# Patient Record
Sex: Male | Born: 1935 | Race: White | Hispanic: No | Smoking: Never smoker
Health system: Southern US, Community
[De-identification: ages and names within clinical notes are randomized; demographics above are authoritative.]

## PROBLEM LIST (undated history)

## (undated) DIAGNOSIS — I351 Nonrheumatic aortic (valve) insufficiency: Secondary | ICD-10-CM

## (undated) DIAGNOSIS — F32A Depression, unspecified: Secondary | ICD-10-CM

## (undated) DIAGNOSIS — F419 Anxiety disorder, unspecified: Secondary | ICD-10-CM

## (undated) DIAGNOSIS — M1990 Unspecified osteoarthritis, unspecified site: Secondary | ICD-10-CM

## (undated) DIAGNOSIS — I509 Heart failure, unspecified: Secondary | ICD-10-CM

## (undated) DIAGNOSIS — G4700 Insomnia, unspecified: Secondary | ICD-10-CM

## (undated) DIAGNOSIS — F329 Major depressive disorder, single episode, unspecified: Secondary | ICD-10-CM

## (undated) DIAGNOSIS — I071 Rheumatic tricuspid insufficiency: Secondary | ICD-10-CM

## (undated) HISTORY — DX: Rheumatic tricuspid insufficiency: I07.1

## (undated) HISTORY — PX: HEMORRHOID SURGERY: SHX153

## (undated) HISTORY — DX: Major depressive disorder, single episode, unspecified: F32.9

## (undated) HISTORY — DX: Insomnia, unspecified: G47.00

## (undated) HISTORY — DX: Depression, unspecified: F32.A

## (undated) HISTORY — PX: HERNIA REPAIR: SHX51

## (undated) HISTORY — DX: Nonrheumatic aortic (valve) insufficiency: I35.1

## (undated) HISTORY — DX: Anxiety disorder, unspecified: F41.9

## (undated) HISTORY — DX: Unspecified osteoarthritis, unspecified site: M19.90

## (undated) HISTORY — PX: TOTAL KNEE ARTHROPLASTY: SHX125

## (undated) HISTORY — PX: APPENDECTOMY: SHX54

## (undated) HISTORY — PX: OTHER SURGICAL HISTORY: SHX169

---

## 2001-01-02 ENCOUNTER — Ambulatory Visit (HOSPITAL_COMMUNITY): Admission: RE | Admit: 2001-01-02 | Discharge: 2001-01-02 | Payer: Self-pay | Admitting: Specialist

## 2001-01-28 ENCOUNTER — Ambulatory Visit (HOSPITAL_COMMUNITY): Admission: RE | Admit: 2001-01-28 | Discharge: 2001-01-28 | Payer: Self-pay | Admitting: Specialist

## 2001-11-09 ENCOUNTER — Encounter: Payer: Self-pay | Admitting: Family Medicine

## 2001-11-09 ENCOUNTER — Ambulatory Visit (HOSPITAL_COMMUNITY): Admission: RE | Admit: 2001-11-09 | Discharge: 2001-11-09 | Payer: Self-pay | Admitting: Family Medicine

## 2001-12-23 ENCOUNTER — Encounter: Admission: RE | Admit: 2001-12-23 | Discharge: 2001-12-23 | Payer: Self-pay | Admitting: Family Medicine

## 2001-12-23 ENCOUNTER — Encounter: Payer: Self-pay | Admitting: Family Medicine

## 2002-08-07 ENCOUNTER — Encounter: Payer: Self-pay | Admitting: Urology

## 2002-08-20 ENCOUNTER — Ambulatory Visit (HOSPITAL_COMMUNITY): Admission: RE | Admit: 2002-08-20 | Discharge: 2002-08-20 | Payer: Self-pay | Admitting: Urology

## 2002-12-12 ENCOUNTER — Encounter: Payer: Self-pay | Admitting: Orthopedic Surgery

## 2002-12-17 ENCOUNTER — Encounter: Payer: Self-pay | Admitting: Orthopedic Surgery

## 2002-12-17 ENCOUNTER — Inpatient Hospital Stay (HOSPITAL_COMMUNITY): Admission: RE | Admit: 2002-12-17 | Discharge: 2002-12-23 | Payer: Self-pay | Admitting: Orthopedic Surgery

## 2002-12-23 ENCOUNTER — Inpatient Hospital Stay (HOSPITAL_COMMUNITY)
Admission: RE | Admit: 2002-12-23 | Discharge: 2003-01-03 | Payer: Self-pay | Admitting: Physical Medicine & Rehabilitation

## 2003-09-04 ENCOUNTER — Encounter: Admission: RE | Admit: 2003-09-04 | Discharge: 2003-09-04 | Payer: Self-pay | Admitting: Family Medicine

## 2004-02-26 ENCOUNTER — Emergency Department (HOSPITAL_COMMUNITY): Admission: EM | Admit: 2004-02-26 | Discharge: 2004-02-26 | Payer: Self-pay | Admitting: Emergency Medicine

## 2006-10-05 ENCOUNTER — Encounter: Admission: RE | Admit: 2006-10-05 | Discharge: 2006-10-05 | Payer: Self-pay | Admitting: Family Medicine

## 2007-02-11 ENCOUNTER — Inpatient Hospital Stay (HOSPITAL_COMMUNITY): Admission: EM | Admit: 2007-02-11 | Discharge: 2007-02-14 | Payer: Self-pay | Admitting: Emergency Medicine

## 2007-03-25 ENCOUNTER — Encounter: Admission: RE | Admit: 2007-03-25 | Discharge: 2007-03-25 | Payer: Self-pay | Admitting: Family Medicine

## 2007-04-21 ENCOUNTER — Emergency Department (HOSPITAL_COMMUNITY): Admission: EM | Admit: 2007-04-21 | Discharge: 2007-04-21 | Payer: Self-pay | Admitting: Emergency Medicine

## 2010-05-09 ENCOUNTER — Inpatient Hospital Stay (HOSPITAL_COMMUNITY)
Admission: EM | Admit: 2010-05-09 | Discharge: 2010-05-10 | DRG: 191 | Disposition: A | Discharge status: Home Health | Payer: Medicare Other | Attending: Internal Medicine | Admitting: Internal Medicine

## 2010-05-09 ENCOUNTER — Emergency Department (HOSPITAL_COMMUNITY): Payer: Medicare Other

## 2010-05-09 DIAGNOSIS — J479 Bronchiectasis, uncomplicated: Principal | ICD-10-CM | POA: Diagnosis present

## 2010-05-09 DIAGNOSIS — F341 Dysthymic disorder: Secondary | ICD-10-CM | POA: Diagnosis present

## 2010-05-09 DIAGNOSIS — R269 Unspecified abnormalities of gait and mobility: Secondary | ICD-10-CM | POA: Diagnosis present

## 2010-05-09 DIAGNOSIS — M1990 Unspecified osteoarthritis, unspecified site: Secondary | ICD-10-CM | POA: Diagnosis present

## 2010-05-09 DIAGNOSIS — Z66 Do not resuscitate: Secondary | ICD-10-CM | POA: Diagnosis present

## 2010-05-09 DIAGNOSIS — Z880 Allergy status to penicillin: Secondary | ICD-10-CM

## 2010-05-09 DIAGNOSIS — Z96659 Presence of unspecified artificial knee joint: Secondary | ICD-10-CM

## 2010-05-09 DIAGNOSIS — R042 Hemoptysis: Secondary | ICD-10-CM | POA: Diagnosis present

## 2010-05-09 DIAGNOSIS — K5900 Constipation, unspecified: Secondary | ICD-10-CM | POA: Diagnosis present

## 2010-05-09 DIAGNOSIS — Z79899 Other long term (current) drug therapy: Secondary | ICD-10-CM

## 2010-05-09 LAB — DIFFERENTIAL
Basophils Absolute: 0 10*3/uL (ref 0.0–0.1)
Eosinophils Absolute: 0.1 10*3/uL (ref 0.0–0.7)
Eosinophils Relative: 1 % (ref 0–5)
Lymphocytes Relative: 18 % (ref 12–46)
Lymphs Abs: 1 10*3/uL (ref 0.7–4.0)
Monocytes Absolute: 0.6 10*3/uL (ref 0.1–1.0)
Monocytes Relative: 10 % (ref 3–12)
Neutro Abs: 4.2 10*3/uL (ref 1.7–7.7)

## 2010-05-09 LAB — CBC
HCT: 39.9 % (ref 39.0–52.0)
Hemoglobin: 13.1 g/dL (ref 13.0–17.0)
MCH: 30.2 pg (ref 26.0–34.0)
MCHC: 32.8 g/dL (ref 30.0–36.0)
MCV: 91.9 fL (ref 78.0–100.0)
RBC: 4.34 MIL/uL (ref 4.22–5.81)
RDW: 13.7 % (ref 11.5–15.5)

## 2010-05-09 LAB — HEPATIC FUNCTION PANEL
ALT: 15 U/L (ref 0–53)
AST: 23 U/L (ref 0–37)
Albumin: 3.7 g/dL (ref 3.5–5.2)
Bilirubin, Direct: 0.1 mg/dL (ref 0.0–0.3)
Total Bilirubin: 0.6 mg/dL (ref 0.3–1.2)
Total Protein: 6.8 g/dL (ref 6.0–8.3)

## 2010-05-09 LAB — BASIC METABOLIC PANEL
BUN: 13 mg/dL (ref 6–23)
CO2: 27 mEq/L (ref 19–32)
Calcium: 9 mg/dL (ref 8.4–10.5)
Creatinine, Ser: 1.08 mg/dL (ref 0.4–1.5)
GFR calc non Af Amer: >60 mL/min (ref >60)
Glucose, Bld: 95 mg/dL (ref 70–99)
Potassium: 4.1 mEq/L (ref 3.5–5.1)
Sodium: 137 mEq/L (ref 135–145)

## 2010-05-09 LAB — URINALYSIS, MICROSCOPIC ONLY
Bilirubin Urine: NEGATIVE
Hgb urine dipstick: NEGATIVE
Ketones, ur: NEGATIVE mg/dL
Leukocytes, UA: NEGATIVE
Nitrite: NEGATIVE
Protein, ur: NEGATIVE mg/dL
Specific Gravity, Urine: 1.028 (ref 1.005–1.030)
Urine Glucose, Fasting: NEGATIVE mg/dL
Urobilinogen, UA: 0.2 mg/dL (ref 0.0–1.0)

## 2010-05-09 LAB — PROTIME-INR: Prothrombin Time: 13.7 seconds (ref 11.6–15.2)

## 2010-05-09 MED ORDER — IOHEXOL 300 MG/ML  SOLN: 80.0000 mL | Freq: Once | INTRAMUSCULAR | Status: AC | PRN

## 2010-05-10 DIAGNOSIS — J479 Bronchiectasis, uncomplicated: Secondary | ICD-10-CM

## 2010-05-10 DIAGNOSIS — R042 Hemoptysis: Secondary | ICD-10-CM

## 2010-05-10 LAB — BASIC METABOLIC PANEL
BUN: 11 mg/dL (ref 6–23)
CO2: 25 mEq/L (ref 19–32)
Chloride: 104 mEq/L (ref 96–112)
Creatinine, Ser: 0.84 mg/dL (ref 0.4–1.5)
GFR calc Af Amer: >60 mL/min (ref >60)
Glucose, Bld: 110 mg/dL — ABNORMAL HIGH (ref 70–99)
Potassium: 4 mEq/L (ref 3.5–5.1)
Sodium: 136 mEq/L (ref 135–145)

## 2010-05-10 LAB — CBC
HCT: 39 % (ref 39.0–52.0)
Hemoglobin: 13 g/dL (ref 13.0–17.0)
MCHC: 33.3 g/dL (ref 30.0–36.0)
MCV: 90.5 fL (ref 78.0–100.0)
Platelets: 194 10*3/uL (ref 150–400)
RDW: 13.5 % (ref 11.5–15.5)
WBC: 6.4 10*3/uL (ref 4.0–10.5)

## 2010-05-10 LAB — DIFFERENTIAL
Basophils Absolute: 0 10*3/uL (ref 0.0–0.1)
Basophils Relative: 0 % (ref 0–1)
Eosinophils Absolute: 0 10*3/uL (ref 0.0–0.7)
Eosinophils Relative: 1 % (ref 0–5)
Lymphocytes Relative: 19 % (ref 12–46)
Lymphs Abs: 1.2 10*3/uL (ref 0.7–4.0)
Monocytes Relative: 7 % (ref 3–12)
Neutro Abs: 4.6 10*3/uL (ref 1.7–7.7)
Neutrophils Relative %: 72 % (ref 43–77)

## 2010-05-10 LAB — MAGNESIUM: Magnesium: 2.2 mg/dL (ref 1.5–2.5)

## 2010-05-11 NOTE — Consult Note (Signed)
NAME:  Russell Fisher, Russell Fisher             ACCOUNT NO.:  [id number]  MEDICAL RECORD NO.:  [id number]           PATIENT TYPE:  I  LOCATION:  1503                         FACILITY:  [facility]  PHYSICIAN:  Coralyn Helling, MD        DATE OF BIRTH:  Jul 29, 1920  DATE OF CONSULTATION:  05/09/2010 DATE OF DISCHARGE:                                CONSULTATION   REASON FOR CONSULTATION:  Hemoptysis.  CHIEF COMPLAINT:  Hemoptysis.  HISTORY OF PRESENT ILLNESS:  Mr. Semper is an 77 year old male who was in his usual state of health until approximately 2-3 days ago when he started developing cough productive of sputum with blood in it.  He says his last episode was on February 27.  He denies headaches, blurred vision, dizziness, sinus congestion, epistaxis, hoarseness, sore throat or difficulty swallowing.  He has not noticed any wheezing, shortness of breath, chest pains, fevers, chills or sweats.  He has not noticed any lymphadenopathy, abdominal pain, nausea, vomiting or diarrhea.  He has a history of peptic ulcer disease and was aware that he was having worsening ulcer disease.  He denies any skin rashes or leg swelling.  He has not noticed any melena or blood in his stools.  There is no prior history of smoking.  He denies any history of asthma, pneumonia or tuberculosis.  Of note is that his radiographic studies dating back to at least 2005 show evidence for cylindrical bronchiectasis in the right middle and right lower lobes, although apparently, Mr. Boulet was unaware of any of this.  PAST MEDICAL HISTORY:  Significant for: 1. Degenerative joint disease. 2. Anxiety. 3. Depression. 4. Insomnia. 5. Duodenal ulcer. 6. Systolic heart failure with ejection fraction of 30%. 7. Mild to moderate aortic regurgitation. 8. Mild-to-moderate tricuspid regurgitation.  PAST SURGICAL HISTORY:  Significant for: 1. Left hydrocele. 2. Hernia repair. 3. Hemorrhoidectomy. 4. Right total knee  replacement. 5. Appendectomy. 6. Bilateral cataract repair.  ALLERGIES:  He has an allergy to PENICILLIN which causes swelling.  SOCIAL HISTORY:  He is divorced, retired.  He used to work as a Air cabin crew.  He denies any smoking or alcohol use.  He is a DNR.  FAMILY HISTORY:  Significant for mother who had heart disease.  MEDICATIONS:  His outpatient medications are Xanax, Flomax, a multivitamin and Metamucil.  REVIEW OF SYSTEMS:  A 12-point review of systems is negative except for as stated above.  PHYSICAL EXAMINATION:  GENERAL:  On physical exam, he is seen in his hospital room.  He is awake, alert, does not appear to be in acute distress.  VITAL SIGNS:  Temperature is 98.0, heart rate is 74, respirations 16, blood pressure is 118/70, oxygen saturation is 96% on room air.  HEENT:  Pupils reactive.  Extraocular muscles intact.  There is no scleral icterus.  There is no sinus tenderness.  There are no oral lesions.  No oral exudate.  No lymphadenopathy.  No thyromegaly. HEART:  S1-S2 with a 2/6 systolic murmur. CHEST:  He had coarse breath sounds with expiratory squeaks and faint expiratory wheezes, more prominent in the mid lung fields.  ABDOMEN: Thin,  soft, nontender.  Positive bowel sounds. EXTREMITIES:  There is no edema, cyanosis or clubbing.  Peripheral pulses are palpable. SKIN:  There are no obvious lesions. NEUROLOGIC:  Cranial nerves II-XII are intact and 5/5 strength.  PERTINENT LABORATORY AND X-RAY DATA:  A CT scan of the chest showed right middle and right lower lobe bronchiectasis, mild dilation of his ascending aorta, coronary artery calcification and cholelithiasis. Abdominal x-ray shows severe constipation.  WBC is 6.4, hematocrit is 39, hemoglobin 13, platelet count is 194, magnesium is 2.2.  Sodium is 136, potassium is 4, chloride is 104, CO2 is 25, BUN is 11, creatinine 0.8, glucose 110, calcium is 9.2. Urinalysis was unremarkable.  PT is 13.7, INR  is 1.03, PTT is 41.  Stool for occult blood was negative.  Bilirubin 0.6, ALP 79, AST is 23, ALT is 15, protein 6.8, albumin is 3.7.  IMPRESSION:  Impression is that of hemoptysis in the setting of bronchiectasis.  I believe that he can be managed on an outpatient basis with antibiotic therapy with Levaquin.  I would also recommend that he be started on inhaler therapy with Symbicort.  He will then need further outpatient followup in the next 1-2 weeks.     Coralyn Helling, MD     VS/MEDQ  D:  05/10/2010  T:  05/10/2010  Job:  [id number]  Electronically Signed by Coralyn Helling MD on 05/11/2010 04:13:36 PM

## 2010-05-11 NOTE — H&P (Signed)
NAME:  Russell Fisher, Russell Fisher             ACCOUNT NO.:  [id number]  MEDICAL RECORD NO.:  [id number]           PATIENT TYPE:  E  LOCATION:  WLED                         FACILITY:  [facility]  PHYSICIAN:  Vania Rea, M.D. DATE OF BIRTH:  1920/09/16  DATE OF ADMISSION:  05/09/2010 DATE OF DISCHARGE:                             HISTORY & PHYSICAL   PRIMARY CARE PHYSICIAN:  Lupita Raider, M.D.  CHIEF COMPLAINT:  Coughing up blood for the past several days.  HISTORY OF PRESENT ILLNESS:  This is an 77 year old Caucasian gentleman who lives alone and reports that he is in very good health, is independent of all activities of daily living.  He does have a lady who comes in to help him clean.  He does not cook.  He eats out.  He has a friend who is his healthcare power of attorney and who looks in on him and accompanies him to the hospital today.  The patient reports that, although he has no history of lung problems, he has been coughing up blood for the past few days, sometimes it looks brown, sometimes when he spits it out, it looks bright red.  He coughs up about a dime-size clots.  He has been having no fever, no purulent sputum.  He has been having no shortness of breath.  No lower extremity edema or leg pains. He has continued to eat well and he has not been losing weight.  The patient is most concerned because some 20-30 years ago, he did have a history of bleeding peptic ulcers that were associated with vomiting of blood, but currently he denies vomiting and has just been coughing. He has never smoked.  He has never been exposed to toxic dust.  PAST MEDICAL HISTORY: 1. Anxiety and depression. 2. Chronic constipation. 3. Status post right knee replacement many years ago. 4. History of bilateral cataract surgery. 5. History of bilateral laser treatment to his eyes.  MEDICATIONS:  Medications include Xanax, Flomax, multivitamins and Metamucil.  ALLERGIES:  PENICILLIN.  SOCIAL  HISTORY:  Denies any history of tobacco, alcohol or illicit drug use.  He is a retired Company secretary.  FAMILY HISTORY:  Significant for mother who died of an acute MI at age 45.  He did not know his biological father and he has brothers and sisters who are now all deceased, but he does not know anything of their personal medical history.  CODE STATUS:  Do not resuscitate.  He is an organ donor and he has made arrangements to have his body donated to research in [place].  HEALTHCARE POWER OF ATTORNEY:  Mr. Russell Fisher, telephone number [phone].  REVIEW OF SYSTEMS:  Review of systems other than noted above, unremarkable.  The patient reports that taking Metamucil helps to keep his stools soft, but even so, he typically has only 1 or 2 stool at the most per week.  The bruise on his ear he reports is due to him slipping  on the towel several ago while giving himself an enema.    .   PHYSICAL EXAM:  GENERAL:  A very pleasant middle-aged Caucasian gentleman sitting up in  the bed. VITAL SIGNS:  His temperature is 97.3, pulse 64, respirations 18, blood pressure 156/84.  He is saturating at 99% on room air. HEENT:  His pupils are round, equal and reactive.  Mucous membranes are pink.  Anicteric.  He is not dehydrated.  No cervical lymphadenopathy or thyromegaly.  No carotid bruit.  No jugular venous distention.  No thyromegaly. CHEST:  Clear to auscultation bilaterally.  He does have mild kyphosis. CARDIOVASCULAR:  Regular rhythm.  No murmur.  His cardiac sounds for some reason are more prominent on the right side of his chest. ABDOMEN:  Soft, nontender.  No masses. EXTREMITIES:  Without edema.  He has some arthritic deformities of the distal phalanx, knees, ankles and toes.  His dorsalis pedis was not felt.  His toes are cold but capillary refill is about 3 seconds.  There is no duskiness or cyanosis. SKIN:  His skin is cool and dry.  There are no ulcerations. CENTRAL NERVOUS  SYSTEM:  Cranial nerves III-XII are grossly intact.  He has no focal lateralizing signs.  PERTINENT LABORATORY AND X-RAY DATA:  White count is 5.9, hemoglobin 13.1, platelets 196.  He has a normal differential.  His sodium is 137, potassium 4.1, chloride 103, CO2 of 27, glucose 95, BUN 13, creatinine 1.8, calcium 9.0.  His PT and INR are normal of 13.7 and 1.03.  His PTT is elevated at 41.  His stool occult blood is negative.  Acute abdominal series with a chest view shows clear lungs, no pleural fluid and severe constipation and degenerative changes in the spine.  CT scan of the chest with contrast shows right middle and right lower lobe bronchiectasis which may be related to the given history of hemoptysis. Mild dilation of the ascending aorta, coronary artery calcification, cholelithiasis.  ASSESSMENT: 1. Hemoptysis, likely related to his bronchiectasis.  No evidence of     tumor.  No evidence of pulmonary embolism.  No evidence of     respiratory failure. 2. Chronic constipation. 3. History of depression. 4. Osteoarthritis.  PLAN: 1. Will bring this gentleman on observation and request a Pulmonary     consult as he has already been discussed with the Pulmonary service     and they in fact recommended it on admission with consult for their     service.  However, if the patient remains stable, he will in all     likelihood be discharged home to continue outpatient followup. 2. We will start this patient on a maintenance dose of Colace and will     recommend a soapsuds enema.     Vania Rea, M.D.     LC/MEDQ  D:  05/09/2010  T:  05/09/2010  Job:  [id number]  cc:   Lupita Raider, M.D. Fax: [phone]  Electronically Signed by Vania Rea M.D. on 05/11/2010 06:03:32 AM

## 2010-05-17 ENCOUNTER — Emergency Department (HOSPITAL_COMMUNITY): Payer: Medicare Other

## 2010-05-17 ENCOUNTER — Emergency Department (HOSPITAL_COMMUNITY)
Admission: EM | Admit: 2010-05-17 | Discharge: 2010-05-17 | Disposition: A | Discharge status: Home / Self Care | Payer: Medicare Other | Attending: Emergency Medicine | Admitting: Emergency Medicine

## 2010-05-17 DIAGNOSIS — F329 Major depressive disorder, single episode, unspecified: Secondary | ICD-10-CM | POA: Insufficient documentation

## 2010-05-17 DIAGNOSIS — Z79899 Other long term (current) drug therapy: Secondary | ICD-10-CM | POA: Insufficient documentation

## 2010-05-17 DIAGNOSIS — F3289 Other specified depressive episodes: Secondary | ICD-10-CM | POA: Insufficient documentation

## 2010-05-17 DIAGNOSIS — R5381 Other malaise: Secondary | ICD-10-CM | POA: Insufficient documentation

## 2010-05-17 DIAGNOSIS — R5383 Other fatigue: Secondary | ICD-10-CM | POA: Insufficient documentation

## 2010-05-17 DIAGNOSIS — R197 Diarrhea, unspecified: Secondary | ICD-10-CM | POA: Insufficient documentation

## 2010-05-17 LAB — DIFFERENTIAL
Basophils Absolute: 0 10*3/uL (ref 0.0–0.1)
Eosinophils Relative: 2 % (ref 0–5)
Lymphocytes Relative: 25 % (ref 12–46)
Lymphs Abs: 1 10*3/uL (ref 0.7–4.0)
Neutro Abs: 2.3 10*3/uL (ref 1.7–7.7)

## 2010-05-17 LAB — URINALYSIS, ROUTINE W REFLEX MICROSCOPIC
Bilirubin Urine: NEGATIVE
Ketones, ur: NEGATIVE mg/dL
Nitrite: NEGATIVE
Protein, ur: NEGATIVE mg/dL
pH: 7 (ref 5.0–8.0)

## 2010-05-17 LAB — COMPREHENSIVE METABOLIC PANEL
ALT: 23 U/L (ref 0–53)
Alkaline Phosphatase: 70 U/L (ref 39–117)
BUN: 18 mg/dL (ref 6–23)
CO2: 28 mEq/L (ref 19–32)
Chloride: 100 mEq/L (ref 96–112)
GFR calc non Af Amer: >60 mL/min (ref >60)
Glucose, Bld: 85 mg/dL (ref 70–99)
Potassium: 4.1 mEq/L (ref 3.5–5.1)
Sodium: 135 mEq/L (ref 135–145)
Total Bilirubin: 0.5 mg/dL (ref 0.3–1.2)
Total Protein: 6.2 g/dL (ref 6.0–8.3)

## 2010-05-17 LAB — OCCULT BLOOD, POC DEVICE: Fecal Occult Bld: NEGATIVE

## 2010-05-17 LAB — CBC
HCT: 36.3 % — ABNORMAL LOW (ref 39.0–52.0)
Hemoglobin: 11.8 g/dL — ABNORMAL LOW (ref 13.0–17.0)
MCV: 90.5 fL (ref 78.0–100.0)
RBC: 4.01 MIL/uL — ABNORMAL LOW (ref 4.22–5.81)
WBC: 3.8 10*3/uL — ABNORMAL LOW (ref 4.0–10.5)

## 2010-05-18 LAB — URINE CULTURE
Colony Count: NO GROWTH
Culture  Setup Time: 201203062213

## 2010-05-19 NOTE — Discharge Summary (Signed)
NAME:  Russell Fisher, Russell Fisher             ACCOUNT NO.:  [id number]  MEDICAL RECORD NO.:  [id number]           PATIENT TYPE:  I  LOCATION:  1503                         [facility]  [facility]  PHYSICIAN:  Russell Fisher, MDDATE OF BIRTH:  [birth year]  DATE OF ADMISSION:  05/09/2010 DATE OF DISCHARGE:  05/10/2010                              DISCHARGE SUMMARY   DISCHARGE DISPOSITION:  Home with home health PT and OT.  PRIMARY CARE PHYSICIAN:  Russell Fisher, M.D.  FINAL DISCHARGE DIAGNOSES: 1. Bronchiectasis. 2. Hemoptysis secondary to bronchiectasis, resolved. 3. Gait abnormality. 4. Anxiety. 5. Chronic constipation. 6. Status post bilateral cataract surgery. 7. Status post right knee replacements years ago. 8. History of bilateral laser treatment for eyes.  DISCHARGE MEDICATIONS:  Include the following: 1. Symbicort 160/4.5 mcg inhale 2 puffs b.i.d. 2. Docusate sodium 100 mg p.o. b.i.d. 3. Levaquin 500 mg p.o. daily. 4. MiraLax 17 grams p.o. daily. 5. Xanax 0.25 mg p.o. q.i.d. p.r.n. anxiety. 6. Amitriptyline 50 mg p.o. q.i.d. 7. Aricept 10 mg p.o. q.h.s. 8. ICAPS 1 capsule p.o. b.i.d. 9. Multivitamin 1 tablet p.o. daily. 10.Ocuvite 1 tablet p.o. b.i.d. 11.Proscar 5 mg p.o. daily. 12.Visine 1 drop to both eyes twice daily.  CONSULTANTS:  Pulmonology, Russell Fisher, M.D.  PROCEDURES:  None.  DIAGNOSTIC STUDIES: 1. Acute abdominal x-ray, which shows severe constipation. 2. CT of the chest with contrast which shows right middle and right     lower lobe bronchiectasis, which may be related to the given     history of hemoptysis.  IMPRESSION: 1. Mild dilation of the ascending aorta. 2. Coronary artery calcification. 3. Cholelithiasis.  CODE STATUS:  Full code.  ALLERGIES:  PENICILLIN.  CHIEF COMPLAINT:  Coughing up blood for past several days.  HISTORY OF PRESENT ILLNESS:  Please refer to the H and P by Dr. Orvan Fisher for details of the HPI.  HOSPITAL COURSE: 1. The  patient came to the hospital complains of coughing up blood     times several days.  CT scan showed bronchiectasis.  Pulmonology     was consulted.  They recommended that he can be discharged on     Levaquin with outpatient followup.  The patient is to follow up     with Dr. Coralyn Fisher on Tuesday, May 31, 2010 at 11:30 a.m. 2. Status post fall.  There is reported fall; however, the patient     denies.  The patient does not have any bruises or anything to     suggest that he did have a fall.  I evaluated the patient's     ambulation myself and the patient does have some unsteadiness of     gait; however, the patient is alert and oriented x3.  His     healthcare power of attorney, Mr. Russell Fisher is here.  I discussed     it with him and the patient wants to go home and is not open to any     alternative disposition.  Mr. Russell Fisher is in agreement with him and     the will be discharged home for home health physical therapy and  occupational therapy to see him in the home and do evaluation for     treatment.  Otherwise the patient has remained stable.  In terms of his constipation, the patient had soapsuds enema, which had good results.  The patient is being sent home with MiraLax and Colace.  DISCHARGE CONDITION:  At the time of discharge, the patient's condition is stable.  DISCHARGE PHYSICAL EXAMINATION:  GENERAL:  Patient is alert and oriented x3, in no acute distress. VITAL SIGNS:  Temperature is 98, heart rate 74, blood pressure 118/70, respiratory rate 16, O2 sats are 96% on room air. HEENT:  He is normocephalic, atraumatic.  Pupils are equally round and reactive to light and accommodation.  Extraocular movements are intact. Oropharynx is moist.  No exudate, erythema or lesions are noted. NECK EXAMINATION:  Trachea is midline.  No masses.  No thyromegaly.  No JVD.  No carotid bruit. RESPIRATORY EXAMINATION:  Patient has a normal respiratory effort, equal excursion  bilaterally.  No wheezing or rhonchi noted. CARDIOVASCULAR:  He has got a normal S1 and S2.  No murmurs, rubs or gallops noted.  PMI is nondisplaced.  He hives or thrills on palpation. ABDOMEN:  Obese, soft, nontender, nondistended.  No masses.  No hepatosplenomegaly is noted. EXTREMITIES:  Show no clubbing, cyanosis or edema. NEUROLOGICAL:  He has got no focal neurological deficits.  Cranial nerves II through XII are grossly intact. PSYCHIATRIC:  He is alert and oriented x3.  Good insight and cognition. Good recent and remote recall.  DIETARY RESTRICTIONS:  None.  PHYSICAL RESTRICTIONS:  Patient to use a walker and will be under the care of home physical therapy.  FOLLOWUP:  He is to follow up with Dr. Craige Fisher on Tuesday, May 31, 2010 at 11:30 a.m. and he should follow up with Dr. Lupita Fisher in 1 week or if needed.  Total time for discharge process 40 minutes.     Russell Harm, MD     MAM/MEDQ  D:  05/10/2010  T:  05/10/2010  Job:  [id number]  cc:   Russell Fisher, M.D. Fax: [phone]  Russell Helling, MD [address] [place], [place] [address]  Electronically Signed by Russell Schiller MD on 05/18/2010 09:59:07 PM

## 2010-05-30 ENCOUNTER — Encounter: Payer: Self-pay | Admitting: Pulmonary Disease

## 2010-05-31 ENCOUNTER — Inpatient Hospital Stay: Payer: Medicare Other | Admitting: Pulmonary Disease

## 2010-07-26 NOTE — H&P (Signed)
NAME:  Fisher, Russell             ACCOUNT NO.:  [id number]   MEDICAL RECORD NO.:  [id number]          PATIENT TYPE:  INP   LOCATION:  2028                         FACILITY:  [facility]   PHYSICIAN:  Ramiro Harvest, MD    DATE OF BIRTH:  1920/06/22   DATE OF ADMISSION:  02/11/2007  DATE OF DISCHARGE:                              HISTORY & PHYSICAL   PRIMARY CARE PHYSICIAN:  Dr. Arvilla Market   HISTORY OF PRESENT ILLNESS:  Russell Fisher is an 77 year old white  male with history of anxiety, depression, history of peptic ulcer  disease with duodenal ulcer, who presents to the ED with generalized  weakness.  Patient states he woke up this morning at 5:30 in the morning  and tried to get up to go to the bathroom when his legs just gave out  from under him and he collapsed to the floor.  Patient was unable to get  up secondary to just generalized weakness and dragged himself to the  living room where he states that he passed out and woke up later under  the coffee table.  Patient was able to when he woke up see the telephone  close by and was able to pull it and called a friend who then called EMS  and EMS brought the patient to the emergency room.  Patient stated that  he does have some shortness of breath and also endorses some pleuritic  chest pain.  Patient states chest pain is constant in nature and with  some radiation to his left upper extremity and his chest pain is left-  sided in nature.  Patient cannot quite describe it and states that the  pain is about a 7 or 8/10.  Patient denies any palpitations, no fevers,  no chills, no nausea, no vomiting, no diarrhea, no diaphoresis, no other  associated symptoms.  Patient does state that he did have some right  ankle numbness that lasted about three to four minutes one day prior to  admission, but no recurrent symptoms.  Patient also does state that he  had problems in terms of calculation from writing on his checkbook from  what he had on  his calculator.  Patient denied any facial droop, no  numbness, no tingling, no garbled speech, no asymmetric weakness, no  blurry vision and no other associated symptoms.  No aggravating, no  alleviating factors.   PAST MEDICAL HISTORY:  1. Degenerative arthritis.  2. History of anxiety.  3. History of depression.  4. Insomnia.  5. Status post hemorrhoidectomy and hernia repair many years ago.  6. Status post total right knee replacement on December 17, 2002.  7. Status post appendectomy.  8. Peptic ulcer disease with history of duodenal ulcer.  9. Bilateral cataracts.  10.Left hydrocele repair.   HOME MEDICATIONS:  1. Elavil 50 mg p.o. q.i.d.  2. Xanax 0.5 mg 1/2 tablet q.i.d.  3. Ocuvite.   SOCIAL HISTORY:  Patient denies any tobacco, alcohol or IV drug use.  Patient is divorced, lives by himself in [place] and usually just  ambulates a few yards secondary to his knee replacement.  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   FAMILY HISTORY:  Noncontributory.   REVIEW OF SYSTEMS:  As per HPI, otherwise negative.   PHYSICAL EXAMINATION:  VITAL SIGNS:  Temperature 98.0, blood pressure  126/72, pulse of 71, respiratory rate 18, sating 100%.  GENERAL:  Patient lying in bed.  HEENT:  Normocephalic, atraumatic.  Pupils equal, round and reactive to  light.  Extraocular movements intact.  Oropharynx is dry.  No lesions,  no exudates.  RESPIRATORY:  Decreased breath sounds in the bases.  CARDIOVASCULAR:  Regular rate and rhythm.  No murmurs, rubs or gallops.  CHEST:  Chest wall is tender to palpation.  ABDOMEN:  Soft, nontender, nondistended.  Positive bowel sounds.  EXTREMITIES:  No clubbing, cyanosis or edema.  NEUROLOGICAL:  Patient is alert and oriented x3.  Cranial nerves II-XII  are grossly intact.  No focal deficits.  Cerebellum is intact.  Sensation is intact.  Unable to elicit reflexes symmetrically.  Gait was  not tested secondary to  safety.   LABS:  White count 8.3, hemoglobin 13.1, platelets 192, hematocrit 37.3.  PT 14.8, INR 1.1.  Sodium 138, potassium 3.5, chloride 99, bicarb 28,  BUN 10, creatinine 0.98, glucose 85, calcium of 9.3.  Bilirubin 1.7,  alkaline phosphatase 70, AST 196, ALT 50, albumin 3.5, protein 6.8.  Initial cardiac enzymes:  CK-MB of 36.4, troponin I of 0.12.   EKG:  Normal sinus rhythm with frequent PACs.   Chest x-ray showed underlying chronic lung changes/COPD and right middle  lobe and right lower lobe infiltrates.   1. Right middle lobe and right lower lobe pneumonia per chest x-ray.      Will check some blood cultures x2, check a urinalysis with cultures      and sensitivities, check a urine strep pneumococcus antigen, check      a sputum Gram-stain and culture.  Will treat empirically with      Rocephin and azithromycin for community-acquired pneumonia and      follow and tailor antibiotics according to culture results.  2. Syncope.  Likely secondary to orthostasis versus cerebrovascular      accident versus cardiac etiology versus infectious etiology.  Will      check orthostatics, check blood cultures.  Will cycle cardiac      enzymes q.8 hours x3.  Will check an EKG now, in the morning check      a 2-D echo to evaluate left ventricular function.  Will check a      head CT to rule out hemorrhage.  Will check a urine with urine      cultures as well.  Will hydrate patient with IV fluids and monitor.  3. Elevated cardiac enzymes.  May be secondary to rhabdomyolysis      versus secondary to a pneumonia versus a cardiac etiology.  Will      cycle cardiac enzymes q.8 hours x2.  Will check a 2-D echo to      evaluate left ventricular dysfunction.  Will fluid resuscitate,      start patient on enteric-coated aspirin 81 mg daily, Lovenox for      DVT prophylaxis.  Will hold beta-blocker right now and not start      that secondary to patient's syncope and orthostasis.  Will monitor      and  get a cardiology consult.  4. Chest pain.  Likely musculoskeletal in nature versus acute coronary      syndrome,  as chest pain is reproducible on palpation.  Will give      ibuprofen p.r.n. as needed and will cycle cardiac enzymes and      follow.  5. Orthostasis ED systolic.  Patient did have a systolic blood      pressure of 139 on lying down and dropped to 109.  Patient is      likely volume depleted.  Will give IV fluids for resuscitation and      monitor and follow patient's volume status.  6. Anxiety/depression.  No suicidal ideation, no homicidal ideation.      Will restart patient on Elavil 50 mg p.o.      t.i.d. and Xanax 0.25 mg p.o. q.6 hours p.r.n.  7. Prophylaxis.  Protonix for GI prophylaxis.  Lovenox for DVT      prophylaxis.   It has been a pleasure taking care of Mr. Lorraine Lax.      Ramiro Harvest, MD  Electronically Signed     DT/MEDQ  D:  02/11/2007  T:  02/11/2007  Job:  [id number]

## 2010-07-26 NOTE — Consult Note (Signed)
NAME:  Chappelle, Dyllon             ACCOUNT NO.:  [id number]   MEDICAL RECORD NO.:  [id number]          PATIENT TYPE:  INP   LOCATION:  1825                         FACILITY:  [facility]   PHYSICIAN:  Cristy Hilts. Jacinto Halim, MD       DATE OF BIRTH:  December 26, 1920   DATE OF CONSULTATION:  02/11/2007  DATE OF DISCHARGE:                                 CONSULTATION   REFERING PHYSICIAN/>  Dr. Roxan Hockey.   REASON FOR CONSULTATION:  Abnormal cardiac enzymes and syncope.   HISTORY:  Mr. Vander Kueker is a fairly active 77 year old gentleman  who lives at his own residence, who had been doing well until yesterday,  he felt generally weak and tired.  This morning around 5:30 he woke up  to go to the bathroom when he suddenly fell to the ground and felt he  probably passed out for a few seconds.  When he woke up, he had pain in  both his elbows where he had hit the ground and as he could not get up  and make the phone call, he waited till around close to 71.  Eventually,  a friend of his had called upon him to check on him, and called the EMS  and he was brought to the [facility] Emergency Room.  He had no chest  pain, no shortness of breath, no PND or orthopnea prior to this onset.  Presently, he complains of pain in the left upper part of his chest on  deep inspiration.   REVIEW OF SYSTEMS:  He denies fever, chills, or cough, or hemoptysis.  He does have frequent urination secondary to benign prostatic  hyperplasia.  He has no recent weight gain or weight loss.  He is not a  diabetic.  He has got mild depression and history of peptic ulcer  disease but no recent bleed or active GI bleed in the recent past.  Other systems are negative.   PRESENT MEDICATIONS:  1. Elavil 50 mg p.o. q.i.d.  2. Xanax 0.5 mg half tab p.o. daily.  3. Optivite one p.o. daily.   ALLERGIES:  PENICILLIN.  He has generalized swelling and rash.   SOCIAL HISTORY:  He lives independently.  He is divorced.  He is fairly  active,  takes care of his home chores.  He drives his own car.   FAMILY HISTORY:  There is no history of premature coronary artery  disease in the family.   PHYSICAL EXAMINATION:  GENERAL:  He is moderately built, well nourished.  He appears to be in no acute distress, although he appears to be ill.  VITAL SIGNS:  Include a heart rate of 72 beats per minute regular,  respirations 16, blood pressure is 126/72-mmHg with positive  orthostatics with a decrease in blood pressure of greater than 10-mmHg  on standing.  Please see Dr. Carollee Massed note.  HEART:  S1 S2 was normal.  There was no gallop or murmur.  CHEST:  There are coarse crackles in the right base.  ABDOMEN:  Soft.  Bowel sounds heard in all quadrants.  EXTREMITIES:  No edema.  Peripheral  vascular exam was grossly normal.   His EKG demonstrates a normal sinus rhythm with frequent PACs.  There is  evidence of ischemia.   His hemoglobin was 13.1 with a hematocrit of 37.3, white count of 8.3,  and platelets of 192,000.  His electrolytes were within normal limits.  BUN 10, creatinine 0.98.  Bilirubin elevated at 1.7.  AST is 196, ALT  50.  His CK-MB was 36.4 - elevated and troponin I was 0.12 - elevated  minimally.  His chest x-ray revealed right middle and right lower lobe  infiltrates consistent with pneumonia.   IMPRESSION:  1. Syncope probably secondary to dehydration, orthostatic, and sepsis.  2. Elevated cardiac enzymes with totally marginally elevated troponin      without the cardiac index being elevated.  This is probably      secondary to a recent fall.  3. Chest pain that is clearly musculoskeletal.   RECOMMENDATIONS:  1. I agree with admitting the patient for treatment of his pneumonia.  2. I would not give him anticoagulation with full dose of heparin or      Lovenox, would use only DVT prophylaxis.  3. Will rule out myocardial infarction by serial enzymes.  4. IF THE ENZYMES KEEP ELEVATING PLEASE CALL us FOR FOLLOWUP  OTHERWISE      MEDICAL THERAPY ONLY AT THIS TIME WITH TREATMENT FOR PNEUMONIA AND      FOLLOW THE PATIENT UP ON AN OUTPATIENT BASIS IN 2-3 WEEKS WHEN HE      IS STABLE FOR FURTHER CARDIOVASCULAR SCREENING AND RISK      STRATIFICATION.  5. I agree with starting him on aspirin at 81 mg once a day.   Thank you for this consult on this pleasant gentleman.  If there are any  questions, please do not hesitate to call upon me.      Cristy Hilts. Jacinto Halim, MD  Electronically Signed     JRG/MEDQ  D:  02/11/2007  T:  02/11/2007  Job:  [id number]   cc:   Donia Guiles, M.D.

## 2010-07-29 NOTE — Discharge Summary (Signed)
NAME:  Russell Fisher, Russell Fisher             ACCOUNT NO.:  [id number]   MEDICAL RECORD NO.:  [id number]          PATIENT TYPE:  INP   LOCATION:  2028                         FACILITY:  [facility]   PHYSICIAN:  Michelene Gardener, MD    DATE OF BIRTH:  21-May-1920   DATE OF ADMISSION:  03-05-2007  DATE OF DISCHARGE:  02/14/2007                               DISCHARGE SUMMARY   PRIMARY PHYSICIAN:  Donia Guiles, M.D.   DISCHARGE DIAGNOSES:  1. Pneumonia.  2. Syncope secondary to orthostatic hypotension.  3. Rhabdomyolysis.  4. Muscular chest pain.  5. Anxiety.  6. Degenerative arthritis.  7. Depression.  8. History of insomnia.   DISCHARGE MEDICATIONS:  New medications:  1. Avelox 400 mg p.o. once daily.  The patient to include old medications that include:  1. Elavil 50 mg p.o. four times daily.  2. Xanax 0.5 mg half tablet once a day as needed.  3. Ocuvite 1 tablet once a day.  4. Aspirin 81 mg once a day.  5. Amitriptyline 50 mg four times daily.   CONSULTATIONS:  Cardiology consult with Dr. Yates Decamp.   PROCEDURES:  None.   FOLLOW UP APPOINTMENT:  With primary physician, Dr. Donia Guiles in 1-  2 weeks.   RADIOLOGY STUDIES:  1. Chest x-ray done on 12-23-2024showed right middle and lower lobe      infiltrates.  2. CT scan of the head without contrast done on 2007/03/05      showed no acute abnormalities.   IMPRESSION AND ASSESSMENT:  1. Pneumonia.  The patient was admitted to the hospital.  The patient      was started on IV antibiotics.  He was given Rocephin and Zithromax      for treatment of community acquired pneumonia.  Initially was      started on oxygen, and then that was tapered off during his      hospitalization.  Blood culture was sent x2 and it came to be      normal.  Urine culture was sent and again it was normal.  At the      time of discharge, the patient was done fine.  There is no fever,      there is no shortness of breath and there is no chest pain.   Was      given Avelox to take for 5 more days.  He was off oxygen and he was      saturating in the upper 90s on room air.  2. Syncope that was secondary to a cerebrovascular accident.  He had a      CT scan of the head that was done and it came to be normal.      Echocardiogram was done on February 12, 2007 that showed decreased      ejection fraction that had already been known.  Cardiology      consultation was done by Dr. Jacinto Halim who recommended to do serial      cardiac enzymes that came to be normal.  It also recommended to do  an echocardiogram.  3. Rhabdomyolysis.  This patient's CK was high at the time of      admission.  He was started on IV fluids.  Repeat CK came to be      high.  Renal function  remained stable during the time of the      hospitalization.  At the time of presentation, his CK was elevated,      and that was actually more than 11,000.  At the time of discharge,      that was improved but is still very high.  He was advised to follow      those numbers with his primary physician.  4. Chest pain that was most likely muscular, in addition to a      pleuritic component secondary to his underlying pneumonia.  Again,      that was resolved during his hospitalization.  5. Anxiety.  The same medications were continued.   DISPOSITION:  At the time of discharge, his echocardiogram was pending,  but by the time of dictation of this note, the results of his  echocardiogram came back and showed a low ejection fraction of 30%.  Will try to get in touch with his primary physician to report those dose  results in case the patient might need to be on some medications for his  underlying cardiomyopathy.  Those medications might include diuretics if  he is congested.  He should be considered for ACE inhibitors and beta  blockers, and depends on his blood pressure and heart rate.  He also  needs to check his CK which has been elevated.   TOTAL ASSESSMENT TIME:  Forty  minutes.      Michelene Gardener, MD  Electronically Signed     NAE/MEDQ  D:  02/19/2007  T:  02/19/2007  Job:  [id number]   cc:   Donia Guiles, M.D.

## 2010-07-29 NOTE — Op Note (Signed)
NAME:  Russell Fisher, Russell Fisher                       ACCOUNT NO.:  [id number]   MEDICAL RECORD NO.:  [id number]                   PATIENT TYPE:  INP   LOCATION:  X003                                 FACILITY:  [facility]   PHYSICIAN:  Georges Lynch. Darrelyn Hillock, M.D.             DATE OF BIRTH:  10/01/1920   DATE OF PROCEDURE:  12/17/2002  DATE OF DISCHARGE:                                 OPERATIVE REPORT   SURGEON:  Georges Lynch. Darrelyn Hillock, M.D.   ASSISTANT:  Ebbie Ridge. Paitsel, P.A.-C.   PREOPERATIVE DIAGNOSIS:  Severe degenerative arthritis, right knee.   POSTOPERATIVE DIAGNOSIS:  Severe degenerative arthritis, right knee.   OPERATION/PROCEDURE:  Right total knee arthroplasty utilized the [employer]  system.  All three components were cemented and vancomycin was used in the  cement.  The sizes used were a 26 patella, a size 7 right posterior cruciate  sacrificing femoral component, size 7 tibial tray with a 12 mm thickness  Flex insert.   DESCRIPTION OF PROCEDURE:  The patient first had 500 mg of vancomycin IV.  At this time no orthopedic prep and draping of the right lower extremity was  carried out.  The legs were exsanguinated and Esmarch and tourniquet were  elevated at 350 mmHg.  Following that, an incision was made over the  anterior aspect of the right knee, bleeders identified and cauterized. Two  flaps were created.  I then carried out a median parapatellar incision.  The  patella was reflected laterally.  The knee was flexed and I did medial and  lateral  meniscectomies and I excised the anterior and posterior cruciates.  Following that, I then made my initial drill hole in the intercondylar  notch.  The intramedullary guide rod was entered and we inserted the #1 jig  and 12 mm thickness was removed from the distal femur.  Following that, we  inserted a #2 jig for size 7 right femur.  We carried out anterior and  posterior and chamfering cuts.  After this was done, we then prepared a  tibia in  the usual fashion by removing 4 mm thickness off the affected  lateral size of the tibia.  After this was completed, we then removed 10 mm  thickness from the articular surface of our patella for a size 26 patella.  Three drill holes were made in the patella.  We then flexed the knee, went  through range of motion with flexion and extension and felt that the 12 mm  thickness tibial insert gave [place] the most stability.  We then removed the  trial component and then cut our keel cut out of the proximal tibia.  Following that, then thoroughly waterpicked the knee, dried the knee out and  submitted all three components in simultaneously.  We did use vancomycin in  the cement.  We then checked, made sure all loose pieces of cement were  removed.  We then tried a  10 mm thickness insert, felt that was not stable  so we went to the a 12 mm thickness Flex insert.  We had good flexion and  extension and good stability.  We then inserted our permanent insert,  thoroughly, irrigated out the knee and inserted the Hemovac drain, closed  the knee in layers in the usual fashion.  A sterile bundle dressing was  applied.  The patient had 500 mg of vancomycin preoperatively.                                                Ronald A. Darrelyn Hillock, M.D.    RAG/MEDQ  D:  12/17/2002  T:  12/17/2002  Job:  [id number]

## 2010-07-29 NOTE — Discharge Summary (Signed)
NAME:  Russell Fisher, Russell Fisher                       ACCOUNT NO.:  [id number]   MEDICAL RECORD NO.:  [id number]                   PATIENT TYPE:  IPS   LOCATION:  4145                                 FACILITY:  [facility]   PHYSICIAN:  Erick Colace, M.D.           DATE OF BIRTH:  07/13/1920   DATE OF ADMISSION:  12/23/2002  DATE OF DISCHARGE:  01/03/2003                                 DISCHARGE SUMMARY   DISCHARGE DIAGNOSES:  1. Right total knee replacement.  2. History of depression exacerbated secondary to situation.  3. Insomnia.   HISTORY OF PRESENT ILLNESS:  Russell Fisher is a 77 year old male with a  history of right knee OA who having failed conservative therapy elected to  undergo right total knee replacement October 6 by Dr. Darrelyn Hillock.  Postoperative, he is weight bearing as tolerated, on Coumadin for DVT  prophylaxis. Post surgery, the patient has had issues with disorientation as  well as lethargy, question secondary to narcotics, and these were  discontinued. Darvocet today, with a decrease in lethargy. Physical therapy  initiated, and patient at mod assist for transfers, mod assist to ambulate  four steps with a rolling walker.   PAST MEDICAL HISTORY:  1. Left hydrocele repair.  2. Hemorrhoidectomy  3. Incision bilateral cataracts.  4. Appendectomy.  5. PUD with history of duodenal ulcer.   ALLERGIES:  PENICILLIN.   SOCIAL HISTORY:  The patient lives alone in one level home with one step to  entry. Was independent prior to admission. He does not use any tobacco or  alcohol.   HOSPITAL COURSE:  Russell Fisher was admitted to rehab on December 23, 2002 for inpatient therapy to consist of PT/OT daily. The past admission, he  was maintained on Coumadin for DVT prophylaxis. At time of admission, the  patient was noted to be alert and oriented x3, able to follow basic commands  but requiring redirection. The patient reported a history of depression with  use of Xanax  and Elavil t.i.d. to q.i.d. basis according to his mood. He was  maintained on iron supplements for postoperative anemia. Xanax was initially  kept at home dose; however, secondary to sedation and disorientation, the  dose was cut in half. Elavil was changed to q.h.s. The patient reported some  issues with depressed mood, especially in terms of his functioning and  lifestyle changes. He also reported problems with insomnia. Trazodone was  added q.h.s. Secondary to patient's mood, continued to report depressed  mood, Lexapro 5 mg was added q.d., and Dr. Leonides Cave of neuropsych was  consulted for input and assistance. Dr. Maxwell Marion evaluation revealed patient  to have adjustment disorder , no evidence of immediate depression, and he  recommended giving patient encouragement and support. May followup in the  next few weeks to check efficacy of Lexapro. As patient's medications were  simplified, his cognitive status did improve greatly. He was able to make  good progress  to modified independent level. Also his postoperative anemia  has been followed along; last CBC of October 16 shows hemoglobin of 11.6,  hematocrit 33.8, white count 4.8, platelets 376. The patient was noted to  have some issues with hypokalemia requiring supplementation. Last check labs  on October 16 showed sodium 134, potassium 3.3, chloride 104, CO2 22, BUN  10, creatinine 0.8, glucose 89. His hypokalemia was contributed in part  secondary to diarrhea. This was supplemented during his stay.   As patient had improvement in his mental status, improvement in safety  awareness, and improved participation, he was able to progress to modified  independent for transfers, modified independent for ambulating 200 feet with  a rolling walker. He required supervision for navigating one step. Knee  flexion was at 100 degrees with extension, lateral 115. The patient was  modified independent for body care. He does require supervision for  shower  tub transfers. The patient was modified independent for simple home  management tasks. The patient to continue on Coumadin through January 17, 2003 with [facility] to follow Coumadin. On January 03, 2003, the patient is discharged to home.   DISCHARGE MEDICATIONS:  1. Trinsicon one p.o. b.i.d.  2. Elavil 25 mg q.h.s.  3. Xanax 0.5 mg half p.o. t.i.d. p.r.n.  4. Coumadin 2 mg one and a half q.h.s.  5. Lexapro 5 mg a day.  6. Ultram 50 mg q.i.d. p.r.n. pain, or may use Tylenol.   ACTIVITY:  Use walker.   DIET:  Regular.   WOUND CARE:  Keep area clean and dry.   SPECIAL INSTRUCTIONS:  No alcohol, no smoking, no driving, no aspirin or  aspirin-containing products while on Coumadin. Home health RN to check pro  time Monday October 25.   FOLLOW UP:  The patient is to followup with Dr. Darrelyn Hillock for postoperative  check in two weeks. Followup with Dr. Arvilla Market for routine check.      Greg Cutter, P.A.                    Erick Colace, M.D.    PP/MEDQ  D:  02/10/2003  T:  02/10/2003  Job:  [id number]   cc:   Donia Guiles, M.D.  [address] [place]  [place] [address]  Fax: [phone]   Georges Lynch. Darrelyn Hillock, M.D.  [address]  [place]  [place] [address]  Fax: [phone]

## 2010-07-29 NOTE — H&P (Signed)
NAME:  Russell Fisher, Russell Fisher                       ACCOUNT NO.:  [id number]   MEDICAL RECORD NO.:  [id number]                   PATIENT TYPE:  INP   LOCATION:  NA                                   FACILITY:  [facility]   PHYSICIAN:  Georges Lynch. Darrelyn Hillock, M.D.             DATE OF BIRTH:  1920-07-14   DATE OF ADMISSION:  DATE OF DISCHARGE:                                HISTORY & PHYSICAL   ANTICIPATED DATE OF ADMISSION:  December 17, 2002.   HISTORY:  The patient has had right knee pain for the past several years.  Over the past few months he has had increasing symptoms.  He has had  numerous cortisone injections in the past which have helped temporarily but  they are no longer providing relief.  The patient has severe degenerative  arthritis of his right knee and he has elected to proceed with right total  knee arthroplasty.  The patient has also taken nonsteroidal anti-  inflammatory medications in the past with little relief over the past few  months.   PRIMARY CARE PHYSICIAN:  Donia Guiles, M.D.   ALLERGIES:  PENICILLIN.   PAST MEDICAL HISTORY:  1. Degenerative arthritis.  2. Anxiety.  3. Depression.   CURRENT MEDICATIONS:  1. Xanax 0.5 mg q.i.d.  2. Amitriptyline 50 mg q.i.d.   PAST SURGICAL HISTORY:  Hemorrhoidectomy and hernia repair many years ago.   FAMILY HISTORY:  Mother died of an MI at age 65.   REVIEW OF SYSTEMS:  GENERAL:  Denies weight changes, fever, chills, fatigue.  HEENT:  Denies headache, visual changes, tinnitus, hearing loss, or sore  throat.  CARDIOVASCULAR:  Denies chest pain, palpitations, shortness of  breath, orthopnea.  PULMONARY:  Denies dyspnea, wheezing, cough, sputum  production, or hemoptysis.  GI:  Denies dysphagia, nausea, vomiting,  hematemesis, or abdominal pain. GU:  Denies dysuria, frequency, urgency.  Occasionally has nocturia.  ENDOCRINE:  Denies polyuria, polydipsia,  appetite change, heat or cold intolerance.  MUSCULOSKELETAL:  The  patient  has severe right knee pain with decreased range of motion.  NEUROLOGICAL:  Denies dizziness, vertigo, syncope, or seizure.  SKIN:  Denies itching,  rashes, masses, or moles.   PHYSICAL EXAMINATION:  VITAL SIGNS:  Temperature 98.4, pulse 68,  respirations 18, blood pressure 110/80 right arm sitting.  GENERAL:  An 77 year old male in no acute distress.  HEENT:  PERRL, EOMs intact, pharynx clear, TMs intact.  NECK:  Supple without masses.  CHEST:  Clear to auscultation bilaterally.  No wheezing, rales, or rhonchi  noted.  HEART:  Regular rate and rhythm without murmur, gallop, or rub.  ABDOMEN:  Positive bowel sounds.  Soft, nontender.  No organomegaly or  abnormal masses.  EXTREMITIES:  Examination of his right knee reveals decreased range of  motion.  He lacks about 15-20 degrees extension.  There is crepitus and pain  with range of motion.  SKIN:  Warm and dry.  X-ray of his right knee reveals severe degenerative arthritis right knee.   PLAN:  The patient is to be admitted to [facility] December 17, 2002  to undergo a right total knee arthroplasty.     Ebbie Ridge. Paitsel, P.A.                     Ronald A. Darrelyn Hillock, M.D.    Tilden Dome  D:  12/15/2002  T:  12/15/2002  Job:  [id number]

## 2010-07-29 NOTE — Discharge Summary (Signed)
NAME:  Russell Fisher, Russell Fisher                       ACCOUNT NO.:  [id number]   MEDICAL RECORD NO.:  [id number]                   PATIENT TYPE:  INP   LOCATION:  0469                                 FACILITY:  [facility]   PHYSICIAN:  Georges Lynch. Darrelyn Hillock, M.D.             DATE OF BIRTH:  09-13-1920   DATE OF ADMISSION:  12/17/2002  DATE OF DISCHARGE:  12/23/2002                                 DISCHARGE SUMMARY   ADMISSION DIAGNOSES:  1. Degenerative arthritis right knee.  2. Anxiety.  3. Depression.   DISCHARGE DIAGNOSES:  1. Degenerative arthritis right knee status post right total knee     replacement.  2. Anxiety.  3. Depression.   PROCEDURE:  The patient was taken to the operating room on December 17, 2002  to undergo a right total knee arthroplasty.  Surgeon Georges Lynch. Darrelyn Hillock, M.D.  Assistant Ebbie Ridge. Paitsel, P.A.  Surgery was performed under spinal  anesthesia.  Hemovac drain was placed at the time of surgery.   CONSULTS:  Physical therapy, occupational therapy.   BRIEF HISTORY:  This patient is an 77 year old male who has had severe right  knee pain for the past several years.  Over the past few months he has had  increasing pain in his knee.  He has had numerous cortisone injections in  the past which offered him temporary relief, but, no longterm providing pain  relief.  The patient has severe degenerative arthritis of his right knee and  has elected to proceed with a right total knee arthroplasty.  The patient is  also taking nonsteroidal anti-inflammatory medication with relief in the  past but over the past several months he remains symptomatic.   LABORATORY DATA:  Pre admission CBC:  WBC 3.1 (slightly low), RBC 4.58,  hemoglobin 14.0, hematocrit 40.8, platelet count 201,000.  Pre admission PT  13.0, INR 1.0, PTT 38.  Pre admission chemistries normal except for slightly  elevated glucose at 107.  Pre admission EKG:  Normal sinus rhythm with a  rate of 83.  Pre admission  x-ray of his right knee reveals tricompartmental  osteoarthritis with severe involvement of the lateral compartment.  He also  has tibial valgus deformity.  Pre admission chest x-ray is negative for  acute cardiac or pulmonary process.  The patient's hemoglobin and hematocrit  were followed throughout his hospital admission.  He did have decrease in  hemoglobin postoperative day two to 8.3.  He did require blood transfusion.  His hemoglobin stabilized at _.9 postoperatively.   HOSPITAL COURSE:  The patient was admitted to [facility].  Was  taken to the operating room.  He underwent the above stated procedure  without complication.  The patient tolerated the procedure well.  Was  allowed to return to the recovery room and then to the orthopedic floor to  continue his postoperative care.  Hemovac drain was placed at the time of  surgery.  It was discontinued on postoperative day one.  The patient's  hemoglobin and hematocrit were followed throughout his hospitalization.  He  did have a postoperative drop in his hemoglobin on postoperative day three  which did require a blood transfusion.  The patient's hemoglobin stabilized  and he did not require further transfusion.  The patient was very slow to  progress.  He was having difficulty ambulating without assistance.  It was  felt that he could benefit from a rehabilitation admission.  The patient was  evaluated by the rehabilitation department and it was felt that he met their  criteria.  He was transferred to rehabilitation on December 23, 2002.   DISPOSITION:  Stable.   TRANSFER MEDICATIONS:  1. Coumadin dosed per pharmacy.  2. Percocet 10/650 one to two q.4-6h. as needed for pain.  3. Robaxin 500 mg one q.6h. as needed for muscle spasm.  4. Elavil 25 mg h.s.  5. Xanax 0.5 mg t.i.d. p.r.n. anxiety.   DIET:  As tolerated.   ACTIVITY:  Total knee precautions.  Full weightbearing with walker.   FOLLOWUP:  The patient is  scheduled to follow up with Windy Fast A. Gioffre,  M.D. one week after his discharge for rehabilitation.   CONDITION AT TIME OF TRANSFER:  Improved.     Ebbie Ridge. Paitsel, P.A.                     Ronald A. Darrelyn Hillock, M.D.    VWU/JWJX  D:  01/14/2003  T:  01/14/2003  Job:  [id number]

## 2010-07-29 NOTE — Discharge Summary (Signed)
NAME:  Russell Fisher, Russell Fisher             ACCOUNT NO.:  [id number]   MEDICAL RECORD NO.:  [id number]          PATIENT TYPE:  INP   LOCATION:  2028                         FACILITY:  [facility]   PHYSICIAN:  Michelene Gardener, MD    DATE OF BIRTH:  06/13/1920   DATE OF ADMISSION:  02/11/2007  DATE OF DISCHARGE:  02/14/2007                               DISCHARGE SUMMARY   This patient had an echocardiogram that was done on February 12, 2007;  showing ejection fraction of 30%.  It also showed mild to moderate  aortic valvular regurgitation,  mild aortic root dilatation, and mild to  moderate tricuspid regurgitation.  I called Dr. Lupe Carney and  discussed the findings of the echocardiogram with him.  We discussed the  possibility of re-evaluating the patient to see if he needs to be  covered for cardiomyopathy.  He might need diuretics, beta-blockers and  ACE inhibitors, depends on his blood pressure and his clinical  condition.  He might need also further assessment with cardiology  evaluation, and possible stress test; if clinical condition indicates  that.   I also discussed with him his last CK, which has been elevated with  normal kidney function; and the need for follow-up to recheck his CK and  his renal function.      Michelene Gardener, MD  Electronically Signed     NAE/MEDQ  D:  02/19/2007  T:  02/19/2007  Job:  [id number]   cc:   Donia Guiles, M.D.

## 2010-07-29 NOTE — H&P (Signed)
   NAME:  Russell Fisher, Russell Fisher                       ACCOUNT NO.:  [id number]   MEDICAL RECORD NO.:  [id number]                   PATIENT TYPE:  AMB   LOCATION:  DAY                                  FACILITY:  [facility]   PHYSICIAN:  Rozanna Boer., M.D.      DATE OF BIRTH:  1920-04-20   DATE OF ADMISSION:  08/20/2002  DATE OF DISCHARGE:                                HISTORY & PHYSICAL   PRE-OP. HISTORY AND PHYSICAL:   BRIEF HISTORY:  This 77 year old patient is admitted with a large left  hydrocele for repair.  It has been present for several years but bigger the  last six months.  He does not have any pain but the size gets in the way.  He had a large residual but passed out when he a UroXatral sample earlier.  His MRI and CAT scan were negative.  He had a double hernia in the 1960s,  hemorrhoidectomy 1956 and appendectomy 1960 as his only previous operations.   DRUG ALLERGIES:  1. PENICILLIN.  2. ARICEPT.   MEDICATIONS:  1. Include Alprazolam.  2. Xanax.   REVIEW OF SYSTEMS:  He has had good general health.  No cardiac or pulmonary  symptomatology except he has had some recent memory problems.  No GI  complaints.   FAMILY HISTORY AND SOCIAL HISTORY:  He has been divorced for 50 years, lives  alone and retired from [employer] where he worked for many years.   PHYSICAL EXAMINATION:  VITAL SIGNS:  Temperature 97.6, pulse 86,  respirations 18, blood pressure 121/69, weight 174.  GENERAL:  He is an elderly well dressed white male in no acute distress.  NECK:  Supple.  CHEST:  Clear.  No cardiac murmurs or gallops.  ABDOMEN:  Soft with a right lower quadrant scar.  GENITOURINARY:  He has a large left hydrocele that measures 10 x 7 cm,  nontender.  His right testicle normal.  His prostate is fairly large about  50 grams, not fixed or indurated.  Seminal vesicle is not palpated.  No  induration.  EXTREMITIES:  No edema.  Good distal pulses.   IMPRESSION:  1. Large  left hydrocele.  2. Mild benign prostatic hypertrophy.  Recommend left hydrocelectomy with     the patient to be admitted for 23 hour observation.                                               Rozanna Boer., M.D.   HMK/MEDQ  D:  08/19/2002  T:  08/19/2002  Job:  [id number]

## 2010-07-29 NOTE — Op Note (Signed)
   NAME:  Russell Fisher, Russell Fisher                       ACCOUNT NO.:  [id number]   MEDICAL RECORD NO.:  [id number]                   PATIENT TYPE:  AMB   LOCATION:  DAY                                  FACILITY:  [facility]   PHYSICIAN:  Rozanna Boer., M.D.      DATE OF BIRTH:  [birth year]   DATE OF PROCEDURE:  08/20/2002  DATE OF DISCHARGE:                                 OPERATIVE REPORT   PREOPERATIVE DIAGNOSIS:  Left hydrocele.   POSTOPERATIVE DIAGNOSIS:  Left hydrocele.   OPERATION:  Left hydrocelectomy.   ANESTHESIA:  General.   SURGEON:  Courtney Paris, M.D.   BRIEF HISTORY:  This 77 year old patient has had a longstanding left  hydrocele that is slowly getting larger and causing him to have some  difficulty, no pain, just the size is getting in the way.  It measures 10 x  7 cm on examination.   DESCRIPTION OF PROCEDURE:  The patient was placed on the operating table in  the supine position.  After satisfactory induction of general anesthesia,  was given 80 mg of gentamycin IV.  A midline incision was made over the left  hemiscrotal compartment and carried down through the scrotal layers with the  Bovie until the hydrocele sac was opened.  A large amount of fluid was  obtained.  The 1 cm (pearl) was then removed, and the testis was delivered  through this incision.  The surface of the testis was quite scarred, but it  otherwise was normal and had previous normal scrotal ultrasound.  The  epididymis looked normal as well.  The tunica vaginalis was sutured  posteriorly behind the testis with a running 3-0 chromic catgut suture, and  the testis was placed back in the left hemiscrotal compartment.  The scrotal  area was then closed with a running 3-0 chromic with a scrotal layer and  then interrupted 4-0 mattress sutures for the skin.  Dressing of collodium  was applied after injecting 4 mL of 0.25% Marcaine in the incision for  postoperative pain relief.  The patient  was taken to the recovery room in  good condition and originally was going to be kept overnight because he has  been divorced for 50 years and lives alone and has some memory problems but  since he had a friend to stay with him, we were going to let him go as an  outpatient.  Sponge, instrument, and needle counts were correct.                                               Rozanna Boer., M.D.    HMK/MEDQ  D:  08/20/2002  T:  08/20/2002  Job:  [id number]

## 2010-11-21 ENCOUNTER — Emergency Department (HOSPITAL_COMMUNITY)
Admission: EM | Admit: 2010-11-21 | Discharge: 2010-11-22 | Disposition: A | Discharge status: Home / Self Care | Payer: Medicare Other | Attending: Emergency Medicine | Admitting: Emergency Medicine

## 2010-11-21 DIAGNOSIS — K5900 Constipation, unspecified: Secondary | ICD-10-CM | POA: Insufficient documentation

## 2010-11-21 DIAGNOSIS — R079 Chest pain, unspecified: Secondary | ICD-10-CM | POA: Insufficient documentation

## 2010-11-21 DIAGNOSIS — S2249XA Multiple fractures of ribs, unspecified side, initial encounter for closed fracture: Secondary | ICD-10-CM | POA: Insufficient documentation

## 2010-11-21 DIAGNOSIS — M25559 Pain in unspecified hip: Secondary | ICD-10-CM | POA: Insufficient documentation

## 2010-11-21 DIAGNOSIS — Y92009 Unspecified place in unspecified non-institutional (private) residence as the place of occurrence of the external cause: Secondary | ICD-10-CM | POA: Insufficient documentation

## 2010-11-21 DIAGNOSIS — R296 Repeated falls: Secondary | ICD-10-CM | POA: Insufficient documentation

## 2010-11-22 ENCOUNTER — Emergency Department (HOSPITAL_COMMUNITY): Payer: Medicare Other

## 2010-12-19 LAB — URINALYSIS, ROUTINE W REFLEX MICROSCOPIC
Bilirubin Urine: NEGATIVE
Specific Gravity, Urine: 1.012
Urobilinogen, UA: 0.2
pH: 6

## 2010-12-19 LAB — LIPID PANEL
HDL: 44
LDL Cholesterol: 66
Total CHOL/HDL Ratio: 2.8
Triglycerides: 57
VLDL: 11

## 2010-12-19 LAB — URINE DRUGS OF ABUSE SCREEN W ALC, ROUTINE (REF LAB)
Benzodiazepines.: NEGATIVE
Marijuana Metabolite: NEGATIVE
Opiate Screen, Urine: NEGATIVE
Propoxyphene: NEGATIVE

## 2010-12-19 LAB — COMPREHENSIVE METABOLIC PANEL
AST: 196 — ABNORMAL HIGH
Albumin: 3.5
Alkaline Phosphatase: 70
CO2: 28
Chloride: 99
GFR calc Af Amer: >60
GFR calc non Af Amer: >60
Potassium: 3.5
Total Bilirubin: 1.7 — ABNORMAL HIGH

## 2010-12-19 LAB — URINE CULTURE
Colony Count: NO GROWTH
Special Requests: NEGATIVE

## 2010-12-19 LAB — URINE MICROSCOPIC-ADD ON

## 2010-12-19 LAB — ETHANOL: Alcohol, Ethyl (B): <5

## 2010-12-19 LAB — CBC
HCT: 33.8 — ABNORMAL LOW
HCT: 37.3 — ABNORMAL LOW
Hemoglobin: 11.9 — ABNORMAL LOW
MCV: 91.4
Platelets: 192
Platelets: 195
RBC: 3.79 — ABNORMAL LOW
RBC: 4.17 — ABNORMAL LOW
WBC: 5.2
WBC: 5.7
WBC: 8.3

## 2010-12-19 LAB — BASIC METABOLIC PANEL
BUN: 7
CO2: 25
Chloride: 108
Glucose, Bld: 113 — ABNORMAL HIGH
Glucose, Bld: 89
Potassium: 3.5
Potassium: 3.5
Sodium: 140

## 2010-12-19 LAB — POCT CARDIAC MARKERS
CKMB, poc: 24.7
Myoglobin, poc: >500
Operator id: 270111
Troponin i, poc: <0.05

## 2010-12-19 LAB — CARDIAC PANEL(CRET KIN+CKTOT+MB+TROPI)
CK, MB: 29.1 — ABNORMAL HIGH
Relative Index: 0.2
Relative Index: 0.3

## 2010-12-19 LAB — CULTURE, BLOOD (ROUTINE X 2)

## 2010-12-19 LAB — DIFFERENTIAL
Basophils Absolute: 0
Basophils Relative: 0
Eosinophils Absolute: 0 — ABNORMAL LOW
Eosinophils Relative: 0
Monocytes Absolute: 0.7

## 2010-12-19 LAB — APTT: aPTT: 38 — ABNORMAL HIGH

## 2011-05-15 ENCOUNTER — Observation Stay (HOSPITAL_COMMUNITY)
Admission: EM | Admit: 2011-05-15 | Discharge: 2011-05-17 | Disposition: A | Discharge status: Home / Self Care | Payer: Medicare Other | Attending: Internal Medicine | Admitting: Internal Medicine

## 2011-05-15 ENCOUNTER — Emergency Department (HOSPITAL_COMMUNITY): Payer: Medicare Other

## 2011-05-15 ENCOUNTER — Encounter (HOSPITAL_COMMUNITY): Payer: Self-pay

## 2011-05-15 ENCOUNTER — Other Ambulatory Visit: Payer: Self-pay

## 2011-05-15 DIAGNOSIS — I509 Heart failure, unspecified: Secondary | ICD-10-CM | POA: Diagnosis present

## 2011-05-15 DIAGNOSIS — Z9181 History of falling: Principal | ICD-10-CM | POA: Insufficient documentation

## 2011-05-15 DIAGNOSIS — E119 Type 2 diabetes mellitus without complications: Secondary | ICD-10-CM | POA: Insufficient documentation

## 2011-05-15 DIAGNOSIS — Z79899 Other long term (current) drug therapy: Secondary | ICD-10-CM | POA: Insufficient documentation

## 2011-05-15 DIAGNOSIS — Z96659 Presence of unspecified artificial knee joint: Secondary | ICD-10-CM | POA: Insufficient documentation

## 2011-05-15 DIAGNOSIS — S0003XA Contusion of scalp, initial encounter: Secondary | ICD-10-CM | POA: Insufficient documentation

## 2011-05-15 DIAGNOSIS — I359 Nonrheumatic aortic valve disorder, unspecified: Secondary | ICD-10-CM | POA: Insufficient documentation

## 2011-05-15 DIAGNOSIS — R0789 Other chest pain: Secondary | ICD-10-CM | POA: Insufficient documentation

## 2011-05-15 DIAGNOSIS — I517 Cardiomegaly: Secondary | ICD-10-CM | POA: Insufficient documentation

## 2011-05-15 DIAGNOSIS — M19049 Primary osteoarthritis, unspecified hand: Secondary | ICD-10-CM | POA: Insufficient documentation

## 2011-05-15 DIAGNOSIS — I771 Stricture of artery: Secondary | ICD-10-CM | POA: Insufficient documentation

## 2011-05-15 DIAGNOSIS — M1990 Unspecified osteoarthritis, unspecified site: Secondary | ICD-10-CM | POA: Diagnosis present

## 2011-05-15 DIAGNOSIS — Y92009 Unspecified place in unspecified non-institutional (private) residence as the place of occurrence of the external cause: Secondary | ICD-10-CM

## 2011-05-15 DIAGNOSIS — I079 Rheumatic tricuspid valve disease, unspecified: Secondary | ICD-10-CM | POA: Insufficient documentation

## 2011-05-15 DIAGNOSIS — M1710 Unilateral primary osteoarthritis, unspecified knee: Secondary | ICD-10-CM | POA: Insufficient documentation

## 2011-05-15 DIAGNOSIS — R55 Syncope and collapse: Secondary | ICD-10-CM | POA: Insufficient documentation

## 2011-05-15 DIAGNOSIS — S0083XA Contusion of other part of head, initial encounter: Secondary | ICD-10-CM

## 2011-05-15 DIAGNOSIS — M47812 Spondylosis without myelopathy or radiculopathy, cervical region: Secondary | ICD-10-CM | POA: Insufficient documentation

## 2011-05-15 DIAGNOSIS — T148XXA Other injury of unspecified body region, initial encounter: Secondary | ICD-10-CM | POA: Diagnosis present

## 2011-05-15 DIAGNOSIS — I5020 Unspecified systolic (congestive) heart failure: Secondary | ICD-10-CM | POA: Insufficient documentation

## 2011-05-15 DIAGNOSIS — R296 Repeated falls: Secondary | ICD-10-CM

## 2011-05-15 DIAGNOSIS — S60229A Contusion of unspecified hand, initial encounter: Secondary | ICD-10-CM | POA: Insufficient documentation

## 2011-05-15 DIAGNOSIS — E162 Hypoglycemia, unspecified: Secondary | ICD-10-CM | POA: Diagnosis present

## 2011-05-15 DIAGNOSIS — W19XXXA Unspecified fall, initial encounter: Secondary | ICD-10-CM | POA: Insufficient documentation

## 2011-05-15 DIAGNOSIS — S1093XA Contusion of unspecified part of neck, initial encounter: Secondary | ICD-10-CM | POA: Insufficient documentation

## 2011-05-15 LAB — BASIC METABOLIC PANEL
Calcium: 9 mg/dL (ref 8.4–10.5)
Creatinine, Ser: 1 mg/dL (ref 0.50–1.35)
GFR calc non Af Amer: 65 mL/min — ABNORMAL LOW (ref >90)
Glucose, Bld: 86 mg/dL (ref 70–99)
Sodium: 137 mEq/L (ref 135–145)

## 2011-05-15 LAB — URINE MICROSCOPIC-ADD ON

## 2011-05-15 LAB — GLUCOSE, CAPILLARY
Glucose-Capillary: 64 mg/dL — ABNORMAL LOW (ref 70–99)
Glucose-Capillary: 172 mg/dL — ABNORMAL HIGH (ref 70–99)

## 2011-05-15 LAB — URINALYSIS, ROUTINE W REFLEX MICROSCOPIC
Protein, ur: NEGATIVE mg/dL
Urobilinogen, UA: 0.2 mg/dL (ref 0.0–1.0)

## 2011-05-15 LAB — CBC
MCH: 31.1 pg (ref 26.0–34.0)
MCV: 89.6 fL (ref 78.0–100.0)
Platelets: 178 10*3/uL (ref 150–400)
RDW: 13.7 % (ref 11.5–15.5)

## 2011-05-15 LAB — DIFFERENTIAL
Basophils Absolute: 0 10*3/uL (ref 0.0–0.1)
Eosinophils Absolute: 0.1 10*3/uL (ref 0.0–0.7)
Eosinophils Relative: 3 % (ref 0–5)

## 2011-05-15 MED ORDER — DEXTROSE 50 % IV SOLN
INTRAVENOUS | Status: AC
  Administered 2011-05-15: 50 mL
  Filled 2011-05-15: qty 50

## 2011-05-15 MED ORDER — SODIUM CHLORIDE 0.9 % IV BOLUS (SEPSIS)
500.0000 mL | Freq: Once | INTRAVENOUS | Status: AC
  Administered 2011-05-15: 500 mL via INTRAVENOUS

## 2011-05-15 NOTE — ED Notes (Signed)
78 year old male who lives by himself reports he has started falling over the past several weeks.  Caregiver estimates 8-10 falls in a 2 week period.  Pt. Denies dizziness, weakness, or any other recent changes in his condition other than the falls.

## 2011-05-15 NOTE — ED Provider Notes (Signed)
Medical screening examination/treatment/procedure(s) were conducted as a shared visit with non-physician practitioner(s) and myself.  I personally evaluated the patient during the encounter   Loren Racer, MD 05/15/11 [phone]

## 2011-05-15 NOTE — ED Notes (Signed)
Patient reports that he lives alone and has had increased falls x 2 weeks. Multiple falls some days. Today patient reports that he has fallen twice. Old bruising noted to upper extremities with bruising to right side of face and swelling to same

## 2011-05-15 NOTE — ED Provider Notes (Signed)
History     CSN: [id number]  Arrival date & time 05/15/11  1534   First MD Initiated Contact with Patient 05/15/11 1655      Chief Complaint  Patient presents with  . Fall    (Consider location/radiation/quality/duration/timing/severity/associated sxs/prior treatment) HPI  78 year old male presenting to the ED with chief complaints of recurrent falls. Patient states for the past several weeks he has been experiencing multiple episodes of fall. The patient lives at home by himself. Patient does not know what caused him to fall. He does not recall dizziness, vision changes, lightheadedness, numbness, or weakness. He denies any prodromal symptoms. He states yesterday he fell 3 times. Today he fall twice. Patient denies any significant pain. He does recall hitting his head from the prior fall but denies ever having any loss of consciousness. Currently patient denies headache, neck pain, chest pain, shortness of breath, abdominal pain, dysuria. States he has been eating and drinking as normal. He denies any recent medication changes  Past Medical History  Diagnosis Date  . DJD (degenerative joint disease)   . Anxiety   . Depression   . Insomnia   . Duodenal ulcer   . Bronchiectasis     ct chest 05/09/10  . Systolic heart failure     with ejection fracture of 30%  . Aortic regurgitation     mild to moderate  . Tricuspid regurgitation     mild to moderate    Past Surgical History  Procedure Date  . Left hydrocele   . Hernia repair   . Hemorrhoid surgery   . Total knee arthroplasty     right  . Appendectomy   . Bilateral cataract repair     Family History  Problem Relation Age of Onset  . Heart disease Mother     History  Substance Use Topics  . Smoking status: Never Smoker   . Smokeless tobacco: Not on file  . Alcohol Use: No      Review of Systems  All other systems reviewed and are negative.    Allergies  Penicillins  Home Medications   Current  Outpatient Rx  Name Route Sig Dispense Refill  . ALPRAZOLAM 0.25 MG PO TABS  1 tablet four times a day as needed     . AMITRIPTYLINE HCL 50 MG PO TABS  One tablet four times a day     . BUDESONIDE-FORMOTEROL FUMARATE 160-4.5 MCG/ACT IN AERO Inhalation Inhale 2 puffs into the lungs 2 (two) times daily.      Marland Kitchen DOCUSATE SODIUM 100 MG PO CAPS  1 tablet twice a day     . DONEPEZIL HCL 10 MG PO TABS  Once a day at bedtime     . FINASTERIDE 5 MG PO TABS  Once a day     . MULTIVITAMINS PO CAPS  Once a day     . ICAPS PO Oral Take by mouth. Once a day     . OCUVITE PO  1 tablet twice a day     . POLYETHYLENE GLYCOL 3350 PO PACK  daily     . VISINE OP Ophthalmic Apply to eye. 1 drop each eye once a day       BP 121/68  Pulse 64  Temp(Src) 98 F (36.7 C) (Oral)  Resp 18  SpO2 97%  Physical Exam  Nursing note and vitals reviewed. Constitutional: He appears well-developed and well-nourished. No distress.       Awake, alert, nontoxic appearance  HENT:  Head: Atraumatic.       No midface tenderness. No hemotympanum. No septal hematoma. Hematoma noted to right zygomatic arch with minimal tenderness to palpation in no obvious deformity.  Eyes: Conjunctivae are normal. Right eye exhibits no discharge. Left eye exhibits no discharge.  Neck: Normal range of motion. Neck supple.       No midline spine tenderness  Cardiovascular: Normal rate and regular rhythm.   Murmur heard.      4/6 systolic murmur best heard at the second to fourth intercostal space on the left side, radiating to the axillary region.  Pulmonary/Chest: Effort normal. No respiratory distress. He exhibits no tenderness.  Abdominal: Soft. There is no tenderness. There is no rebound.  Musculoskeletal: He exhibits no tenderness.       Right hip: Normal.       Left hip: Normal.       Right knee: Normal.       Left knee: Normal.       ROM appears intact, no obvious focal weakness. 4/5 strength to lower extremities bilaterally.    Neurological: He is alert. He displays a negative Romberg sign.       Unsteady gait however no focal neuro deficit. Romberg negative.  Skin: Skin is warm and dry. No rash noted.     Psychiatric: He has a normal mood and affect.    ED Course  Procedures (including critical care time)  Labs Reviewed - No data to display No results found.   No diagnosis found.  7:08 PM Results for orders placed during the hospital encounter of 05/15/11  CBC      Component Value Range   WBC 4.6  4.0 - 10.5 (K/uL)   RBC 4.15 (*) 4.22 - 5.81 (MIL/uL)   Hemoglobin 12.9 (*) 13.0 - 17.0 (g/dL)   HCT 40.9 (*) 81.1 - 52.0 (%)   MCV 89.6  78.0 - 100.0 (fL)   MCH 31.1  26.0 - 34.0 (pg)   MCHC 34.7  30.0 - 36.0 (g/dL)   RDW 91.4  78.2 - 95.6 (%)   Platelets 178  150 - 400 (K/uL)  DIFFERENTIAL      Component Value Range   Neutrophils Relative 63  43 - 77 (%)   Neutro Abs 2.9  1.7 - 7.7 (K/uL)   Lymphocytes Relative 23  12 - 46 (%)   Lymphs Abs 1.0  0.7 - 4.0 (K/uL)   Monocytes Relative 11  3 - 12 (%)   Monocytes Absolute 0.5  0.1 - 1.0 (K/uL)   Eosinophils Relative 3  0 - 5 (%)   Eosinophils Absolute 0.1  0.0 - 0.7 (K/uL)   Basophils Relative 0  0 - 1 (%)   Basophils Absolute 0.0  0.0 - 0.1 (K/uL)  GLUCOSE, CAPILLARY      Component Value Range   Glucose-Capillary 64 (*) 70 - 99 (mg/dL)   Dg Chest 2 View  04/13/3084  *RADIOLOGY REPORT*  Clinical Data: Fall.  Chest pain.  CHEST - 2 VIEW  Comparison: Multiple exams, including 11/22/2010  Findings: Prominent tortuous and ectatic thoracic aorta once again noted.  This appears stable.  Moderate cardiomegaly is present.  No pneumothorax is observed.  Stable interstitial accentuation noted, particularly in the lung bases, with associated airway thickening and bronchiectasis.  No pleural effusion is observed.  Prior sternal and rib fractures are noted.  Acute refracture of a left lower lateral rib cannot be excluded.  There is scarring or subsegmental  atelectasis along  the hemidiaphragms.  IMPRESSION:  1.  Airway thickening and bronchiectasis at the lung bases.  This appears chronic. 2.  Old sternal and rib fractures.  There may be an acute refracture of a left lower lateral rib. 3.  Cardiomegaly, chronic. 4.  Chronic tortuosity and ectasia of the thoracic aorta.  Original Report Authenticated By: Dellia Cloud, M.D.   Ct Head Wo Contrast  05/15/2011  *RADIOLOGY REPORT*  Clinical Data: Facial trauma secondary to a fall due to syncope. Multiple recent falls.  CT HEAD WITHOUT CONTRAST  Technique:  Contiguous axial images were obtained from the base of the skull through the vertex without contrast.  Comparison: None.  Findings: There is no acute intracranial hemorrhage, infarction, or mass lesion.  There is diffuse mild cerebral cortical and cerebellar atrophy with slight periventricular white matter lucency consistent with chronic small vessel ischemic disease, unchanged. No acute osseous abnormalities.  IMPRESSION:  No acute abnormalities.  No change since the prior study of 02/11/2007.  Original Report Authenticated By: Gwynn Burly, M.D.   Ct Cervical Spine Wo Contrast  05/15/2011  *RADIOLOGY REPORT*  Clinical Data: Right face bruising and swelling secondary to trauma from a fall due to syncope.  Multiple recent falls.  CT CERVICAL SPINE WITHOUT CONTRAST  Technique:  Multidetector CT imaging of the cervical spine was performed. Multiplanar CT image reconstructions were also generated.  Comparison: Radiographs dated 02/26/2004  Findings: There is no fracture, acute subluxation, prevertebral soft tissue swelling, or other acute abnormality.  There is reversal of the cervical lordosis with moderate to severe degenerative disc disease at C4-5, C5-6, and C6-7.  There is moderate facet arthritis bilaterally at C2-3 through C4-5 and on the right at the C7-T1.  No visible spinal cord compression.  Impression:  No acute abnormality of the cervical spine.  Multilevel degenerative disc and joint disease.  Original Report Authenticated By: Gwynn Burly, M.D.   Dg Hand Complete Right  05/15/2011  *RADIOLOGY REPORT*  Clinical Data: Fall.  Right hand bruising posteriorly.  RIGHT HAND - COMPLETE 3+ VIEW  Comparison: None.  Findings: Osteoarthritis noted.  No discrete fracture or foreign body is observed.  IMPRESSION:  1.  Osteoarthritis.  No acute findings.  Original Report Authenticated By: Dellia Cloud, M.D.   Ct Maxillofacial Wo Cm  05/15/2011  *RADIOLOGY REPORT*  Clinical Data: Facial trauma secondary to a fall due to syncope.  CT MAXILLOFACIAL WITHOUT CONTRAST  Technique:  Multidetector CT imaging of the maxillofacial structures was performed. Multiplanar CT image reconstructions were also generated.  Comparison: None.  Findings: There is slight soft tissue swelling over the right cheek.  There is no acute osseous abnormality.  Paranasal sinuses are clear.  Chronic partial opacification of the mastoid air cells and right middle ear cavity.  Left submandibular gland appears to have been removed.  Right submandibular gland is normal.  IMPRESSION: Slight soft tissue contusion over the right cheek.  No other acute abnormalities.  Original Report Authenticated By: Gwynn Burly, M.D.      MDM  Recurrent fall in an elderly patient lives at home by himself. Imaging will order. Labs ordered to rule out electrolytes imbalance or anemia. UA ordered to rule out infection. Orthostatic vital signs were ordered. Low suspicion for stroke.  However, will order head ct.   6:56 PM The patient has normal orthostatic vital sign. Labs shows no acute evidence of anemia. CT of head face and neck shows no acute abnormalities. Chest x-ray and hand  x-ray were unremarkable. There is some evidence of old rib fractures and possible acute rib fracture. Patient denies chest pain or shortness of breath currently. His blood sugar is 64 today. Fluid and food were given, CBG  will be rechecked.  7:22 PM Patient's urine shows no evidence of urinary tract infection. However no moderate amount of hemoglobin noted. We did obtain urine via in and out cath. Patient states those discomfort during the procedure. I anticipate that the hemoglobin is likely from trauma of in and out cath. Patient has no CVA tenderness and has not been complaining of any dysuria.  Patient requests to be discharged. However, due to his recurrent falls, i felt that he may need further evaluation, especially since he lives at home by himself. Pain will discuss with my attending, who will see and evaluate the patient.  Pt ate minimally.  Repeat CBG is 50.  I anticipate pt's fall can be hypoglycemia induced.  His electrolytes are within normal limits, mildly decreased GFR.  Urine culture sent.  Discussed care with my attending, who will continue monitoring pt.  Plan to admit due to recurrent falls.     Fayrene Helper, PA-C 05/15/11 2043

## 2011-05-15 NOTE — ED Notes (Signed)
CBG 64

## 2011-05-15 NOTE — ED Notes (Signed)
Placed on monitor. Pt denies pain.Marland KitchenMarland KitchenMarland Kitchen

## 2011-05-15 NOTE — ED Notes (Signed)
CBG 172 

## 2011-05-15 NOTE — Progress Notes (Signed)
ED RN spoke with CM about possible home health needs. No orders from EDP.  CM left Message for attending ED RN to obtain orders from EDP and leave CM a voice message at office extension if services needed

## 2011-05-15 NOTE — ED Notes (Signed)
Pt. Is unable to use the restroom at this time. 

## 2011-05-16 ENCOUNTER — Encounter (HOSPITAL_COMMUNITY): Payer: Self-pay | Admitting: *Deleted

## 2011-05-16 LAB — VITAMIN B12: Vitamin B-12: 807 pg/mL (ref 211–911)

## 2011-05-16 LAB — CBC
HCT: 37.8 % — ABNORMAL LOW (ref 39.0–52.0)
Hemoglobin: 13 g/dL (ref 13.0–17.0)
MCH: 30.6 pg (ref 26.0–34.0)
MCHC: 34.4 g/dL (ref 30.0–36.0)
MCV: 88.9 fL (ref 78.0–100.0)
RBC: 4.25 MIL/uL (ref 4.22–5.81)

## 2011-05-16 LAB — BASIC METABOLIC PANEL
BUN: 16 mg/dL (ref 6–23)
CO2: 25 mEq/L (ref 19–32)
Chloride: 103 mEq/L (ref 96–112)
Glucose, Bld: 107 mg/dL — ABNORMAL HIGH (ref 70–99)
Potassium: 3.7 mEq/L (ref 3.5–5.1)
Sodium: 137 mEq/L (ref 135–145)

## 2011-05-16 LAB — URINE CULTURE
Colony Count: NO GROWTH
Culture: NO GROWTH

## 2011-05-16 LAB — GLUCOSE, CAPILLARY
Glucose-Capillary: 91 mg/dL (ref 70–99)
Glucose-Capillary: 113 mg/dL — ABNORMAL HIGH (ref 70–99)

## 2011-05-16 LAB — C-PEPTIDE: C-Peptide: 3.82 ng/mL (ref 0.80–3.90)

## 2011-05-16 LAB — RPR: RPR Ser Ql: NONREACTIVE

## 2011-05-16 MED ORDER — ALPRAZOLAM 0.25 MG PO TABS: 0.2500 mg | ORAL_TABLET | Freq: Three times a day (TID) | ORAL | Status: DC | PRN

## 2011-05-16 MED ORDER — FINASTERIDE 5 MG PO TABS
5.0000 mg | ORAL_TABLET | Freq: Every day | ORAL | Status: DC
  Administered 2011-05-16 – 2011-05-17 (×2): 5 mg via ORAL
  Filled 2011-05-16 (×2): qty 1

## 2011-05-16 MED ORDER — ENOXAPARIN SODIUM 40 MG/0.4ML ~~LOC~~ SOLN
40.0000 mg | SUBCUTANEOUS | Status: DC
  Administered 2011-05-16 – 2011-05-17 (×2): 40 mg via SUBCUTANEOUS
  Filled 2011-05-16 (×2): qty 0.4

## 2011-05-16 MED ORDER — OCUVITE PO TABS
1.0000 | ORAL_TABLET | Freq: Every day | ORAL | Status: DC
  Administered 2011-05-16 – 2011-05-17 (×2): 1 via ORAL
  Filled 2011-05-16 (×2): qty 1

## 2011-05-16 MED ORDER — AMITRIPTYLINE HCL 25 MG PO TABS
25.0000 mg | ORAL_TABLET | Freq: Four times a day (QID) | ORAL | Status: DC
  Filled 2011-05-16 (×3): qty 1

## 2011-05-16 MED ORDER — CLONAZEPAM 0.125 MG PO TBDP: 0.1250 mg | ORAL_TABLET | Freq: Two times a day (BID) | ORAL | Status: DC

## 2011-05-16 MED ORDER — AMITRIPTYLINE HCL 100 MG PO TABS
100.0000 mg | ORAL_TABLET | Freq: Every day | ORAL | Status: DC
  Administered 2011-05-16: 100 mg via ORAL
  Filled 2011-05-16 (×2): qty 1

## 2011-05-16 MED ORDER — IBUPROFEN 200 MG PO TABS
200.0000 mg | ORAL_TABLET | Freq: Every day | ORAL | Status: DC | PRN
  Filled 2011-05-16: qty 1

## 2011-05-16 MED ORDER — SODIUM CHLORIDE 0.9 % IV SOLN
INTRAVENOUS | Status: DC
  Administered 2011-05-16 – 2011-05-17 (×2): via INTRAVENOUS

## 2011-05-16 MED ORDER — SODIUM CHLORIDE 0.9 % IJ SOLN
3.0000 mL | Freq: Two times a day (BID) | INTRAMUSCULAR | Status: DC
  Administered 2011-05-16 (×2): 3 mL via INTRAVENOUS

## 2011-05-16 MED ORDER — DONEPEZIL HCL 10 MG PO TABS
10.0000 mg | ORAL_TABLET | Freq: Every day | ORAL | Status: DC
  Administered 2011-05-16: 10 mg via ORAL
  Filled 2011-05-16 (×2): qty 1

## 2011-05-16 MED ORDER — DEXTROSE-NACL 5-0.9 % IV SOLN
INTRAVENOUS | Status: DC
  Administered 2011-05-16: 02:00:00 via INTRAVENOUS

## 2011-05-16 MED ORDER — CLONAZEPAM 0.5 MG PO TABS: 0.1250 mg | ORAL_TABLET | Freq: Two times a day (BID) | ORAL | Status: DC

## 2011-05-16 MED ORDER — CLONAZEPAM 0.125 MG PO TBDP
0.1250 mg | ORAL_TABLET | Freq: Two times a day (BID) | ORAL | Status: DC
  Administered 2011-05-16 – 2011-05-17 (×2): 0.125 mg via ORAL
  Filled 2011-05-16 (×2): qty 1

## 2011-05-16 NOTE — Progress Notes (Signed)
CBG: 43  Treatment: 15 GM carbohydrate snack  Symptoms: None  Follow-up CBG: Time:0214 CBG Result:85  Possible Reasons for Event: Inadequate meal intake  Comments/MD notified:None    [employer]

## 2011-05-16 NOTE — Progress Notes (Signed)
Agree with note by Toya Smothers, NP. Etiology remains uncertain. I highly doubt hypoglycemia as he has been falling for over 1 year. TSH/B12/RPR pending. I suspect this is more related to polypharmacy. This 78 y/o man has been receiving elavil 4 times a day and ativan TID.  Will change elavil to at bedtime only and change ativan to klonopin BID which is longer acting. PT is recommending HHPT which will be arranged prior to DC. Altho did not meet strict criteria for orthostasis, his BP did drop close to 20 points from sitting to standing. Elavil is known to cause orthostatic hypotension. If rest of workup negative, then can likely DC home in am.  HERNANDEZ ACOSTA,Nathaniel Yaden

## 2011-05-16 NOTE — Progress Notes (Signed)
Subjective: "I feel a little weak from laying in bed all the time". Reports that "falling is my own fault, my body does not move as quickly as my mind thinks it can" Denies pain/discomfort.  Objective: Vital signs Filed Vitals:   05/15/11 2152 05/16/11 0107 05/16/11 0130 05/16/11 0556  BP: 134/67 182/86 157/80 138/74  Pulse: 69 78 70 65  Temp: 97.9 F (36.6 C) 98.5 F (36.9 C) 98.2 F (36.8 C) 98.3 F (36.8 C)  TempSrc: Oral Oral Oral Oral  Resp: 16 17 16 16   Height:   5\' 8"  (1.727 m)   Weight:   72.6 kg (160 lb 0.9 oz) 72.2 kg (159 lb 2.8 oz)  SpO2: 99% 100% 100% 96%   Weight change:  Last BM Date: 05/15/11  Intake/Output from previous day: 03/04 0701 - 03/05 0700 In: 255 [I.V.:255] Out: 425 [Urine:425] Total I/O In: 240 [P.O.:240] Out: 250 [Urine:250]   Physical Exam: General: 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 Sitting up in chair eating breakfast/watching TV HEENT: No bruits, no goiter. Mucus membranes moist/pink. Ill fitting dentures. VERY HOH PERRL  Bruise over right cheek Heart: Regular rate and rhythm, + murmurs, rubs, gallops. Lungs: Normal effort. Breath sounds clear to auscultation bilaterally. No wheeze Abdomen: Obese, Soft, nontender, nondistended, positive bowel sounds. Extremities: No clubbing cyanosis or edema with positive pedal pulses. Neuro: Grossly intact, nonfocal. Speech clear. Cranial nerve II-XII intact    Lab Results: Basic Metabolic Panel:  Basename 05/16/11 0451 05/15/11 1837  NA 137 137  K 3.7 4.1  CL 103 103  CO2 25 26  GLUCOSE 107* 86  BUN 16 20  CREATININE 0.84 1.00  CALCIUM 8.9 9.0  MG -- --  PHOS -- --   Liver Function Tests: No results found for this basename: AST:2,ALT:2,ALKPHOS:2,BILITOT:2,PROT:2,ALBUMIN:2 in the last 72 hours No results found for this basename: LIPASE:2,AMYLASE:2 in the last 72 hours No results found for this basename: AMMONIA:2 in the last 72  hours CBC:  Basename 05/16/11 0451 05/15/11 1837  WBC 4.5 4.6  NEUTROABS -- 2.9  HGB 13.0 12.9*  HCT 37.8* 37.2*  MCV 88.9 89.6  PLT 206 178   Cardiac Enzymes: No results found for this basename: CKTOTAL:3,CKMB:3,CKMBINDEX:3,TROPONINI:3 in the last 72 hours BNP: No results found for this basename: PROBNP:3 in the last 72 hours D-Dimer: No results found for this basename: DDIMER:2 in the last 72 hours CBG:  Basename 05/16/11 0745 05/16/11 0214 05/16/11 0142 05/15/11 2103 05/15/11 1930 05/15/11 1801  GLUCAP 91 85 43* 172* 50* 64*   Hemoglobin A1C: No results found for this basename: HGBA1C in the last 72 hours Fasting Lipid Panel: No results found for this basename: CHOL,HDL,LDLCALC,TRIG,CHOLHDL,LDLDIRECT in the last 72 hours Thyroid Function Tests: No results found for this basename: TSH,T4TOTAL,FREET4,T3FREE,THYROIDAB in the last 72 hours Anemia Panel: No results found for this basename: VITAMINB12,FOLATE,FERRITIN,TIBC,IRON,RETICCTPCT in the last 72 hours Coagulation: No results found for this basename: LABPROT:2,INR:2 in the last 72 hours Urine Drug Screen: Drugs of Abuse     Component Value Date/Time   LABOPIA NEGATIVE 02/14/2007 0554   COCAINSCRNUR NEGATIVE 02/14/2007 0554   LABBENZ NEGATIVE 02/14/2007 0554   AMPHETMU NEGATIVE 02/14/2007 0554    Alcohol Level: No results found for this basename: ETH:2 in the last 72 hours Urinalysis:  Basename 05/15/11 1852  COLORURINE YELLOW  LABSPEC 1.017  PHURINE 7.0  GLUCOSEU NEGATIVE  HGBUR MODERATE*  BILIRUBINUR NEGATIVE  KETONESUR TRACE*  PROTEINUR NEGATIVE  UROBILINOGEN  0.2  NITRITE NEGATIVE  LEUKOCYTESUR NEGATIVE   Misc. Labs:  No results found for this or any previous visit (from the past 240 hour(s)).  Studies/Results: Dg Chest 2 View  05/15/2011  *RADIOLOGY REPORT*  Clinical Data: Fall.  Chest pain.  CHEST - 2 VIEW  Comparison: Multiple exams, including 11/22/2010  Findings: Prominent tortuous and ectatic  thoracic aorta once again noted.  This appears stable.  Moderate cardiomegaly is present.  No pneumothorax is observed.  Stable interstitial accentuation noted, particularly in the lung bases, with associated airway thickening and bronchiectasis.  No pleural effusion is observed.  Prior sternal and rib fractures are noted.  Acute refracture of a left lower lateral rib cannot be excluded.  There is scarring or subsegmental atelectasis along the hemidiaphragms.  IMPRESSION:  1.  Airway thickening and bronchiectasis at the lung bases.  This appears chronic. 2.  Old sternal and rib fractures.  There may be an acute refracture of a left lower lateral rib. 3.  Cardiomegaly, chronic. 4.  Chronic tortuosity and ectasia of the thoracic aorta.  Original Report Authenticated By: Dellia Cloud, M.D.   Ct Head Wo Contrast  05/15/2011  *RADIOLOGY REPORT*  Clinical Data: Facial trauma secondary to a fall due to syncope. Multiple recent falls.  CT HEAD WITHOUT CONTRAST  Technique:  Contiguous axial images were obtained from the base of the skull through the vertex without contrast.  Comparison: None.  Findings: There is no acute intracranial hemorrhage, infarction, or mass lesion.  There is diffuse mild cerebral cortical and cerebellar atrophy with slight periventricular white matter lucency consistent with chronic small vessel ischemic disease, unchanged. No acute osseous abnormalities.  IMPRESSION:  No acute abnormalities.  No change since the prior study of 02/11/2007.  Original Report Authenticated By: Gwynn Burly, M.D.   Ct Cervical Spine Wo Contrast  05/15/2011  *RADIOLOGY REPORT*  Clinical Data: Right face bruising and swelling secondary to trauma from a fall due to syncope.  Multiple recent falls.  CT CERVICAL SPINE WITHOUT CONTRAST  Technique:  Multidetector CT imaging of the cervical spine was performed. Multiplanar CT image reconstructions were also generated.  Comparison: Radiographs dated 02/26/2004   Findings: There is no fracture, acute subluxation, prevertebral soft tissue swelling, or other acute abnormality.  There is reversal of the cervical lordosis with moderate to severe degenerative disc disease at C4-5, C5-6, and C6-7.  There is moderate facet arthritis bilaterally at C2-3 through C4-5 and on the right at the C7-T1.  No visible spinal cord compression.  Impression:  No acute abnormality of the cervical spine. Multilevel degenerative disc and joint disease.  Original Report Authenticated By: Gwynn Burly, M.D.   Dg Hand Complete Right  05/15/2011  *RADIOLOGY REPORT*  Clinical Data: Fall.  Right hand bruising posteriorly.  RIGHT HAND - COMPLETE 3+ VIEW  Comparison: None.  Findings: Osteoarthritis noted.  No discrete fracture or foreign body is observed.  IMPRESSION:  1.  Osteoarthritis.  No acute findings.  Original Report Authenticated By: Dellia Cloud, M.D.   Ct Maxillofacial Wo Cm  05/15/2011  *RADIOLOGY REPORT*  Clinical Data: Facial trauma secondary to a fall due to syncope.  CT MAXILLOFACIAL WITHOUT CONTRAST  Technique:  Multidetector CT imaging of the maxillofacial structures was performed. Multiplanar CT image reconstructions were also generated.  Comparison: None.  Findings: There is slight soft tissue swelling over the right cheek.  There is no acute osseous abnormality.  Paranasal sinuses are clear.  Chronic partial opacification of  the mastoid air cells and right middle ear cavity.  Left submandibular gland appears to have been removed.  Right submandibular gland is normal.  IMPRESSION: Slight soft tissue contusion over the right cheek.  No other acute abnormalities.  Original Report Authenticated By: Gwynn Burly, M.D.    Medications: Scheduled Meds:   . beta carotene w/minerals  1 tablet Oral Daily  . dextrose      . donepezil  10 mg Oral QHS  . enoxaparin  40 mg Subcutaneous Q24H  . finasteride  5 mg Oral Daily  . sodium chloride  500 mL Intravenous Once  .  sodium chloride  3 mL Intravenous Q12H   Continuous Infusions:   . dextrose 5 % and 0.9% NaCl 50 mL/hr at 05/16/11 0700   PRN Meds:.ibuprofen  Assessment/Plan:  Active Problems: 1. Fall at home: etiology unclear. Likely related to hypoglycemia.  Will be evaluated by PT, will keep on tele to eval rhythm. Will evaluate home meds. TSH, RPR, VIT B12 pending. Sitting to standing SBP drop from 153-137  2. Hypoglycemia: Will get frequent CBG's. Will check C-peptide,sulfonylurea level and anti insulin antibodies.   3. CHF (congestive heart failure): chronic systolic. EF 30%. Does not appear volume overloaded. Will monitor strict I&O's daily weights.   4.Contusion  5.DJD (degenerative joint disease) at baseline. PT for gait assessment. Continue home meds.     LOS: 1 day   [facility] M 05/16/2011, 9:25 AM

## 2011-05-16 NOTE — H&P (Signed)
PCP:   Lupita Raider, MD, MD   Chief Complaint: Frequent falls   HPI: Russell Fisher is an 78 y.o. male with history of DJD, status post right total knee replacement, congestive heart failure with ejection fraction of 30%, mild to moderate AI and TR, diabetes, who had been falling for the past year and a half, but increased frequency this past 3 days. He denied loss of consciousness, palpitations, warning symptoms, vertigo, orthostatic symptomology, chest pain or shortness of breath, fever or chills. He did not exactly state that he tripped and fell. He really does not know why he fell. He denied leg weakness, knees giving way, or having any pain. Evaluation in the emergency room included a blood glucose of 50, normal creatinine, negative UA except he had hematuria, negative CT of his brain and cervical spine. With his fall, he suffered a contusion on his forehead. Hospitalist was asked to admit him for further evaluation. He lives alone.   Rewiew of Systems:  The patient denies anorexia, fever, weight loss,, vision loss, decreased hearing, hoarseness, chest pain, syncope, dyspnea on exertion, peripheral edema, balance deficits, hemoptysis, abdominal pain, melena, hematochezia, severe indigestion/heartburn, hematuria, incontinence, genital sores, muscle weakness, suspicious skin lesions, transient blindness, difficulty walking, depression, unusual weight change, abnormal bleeding, enlarged lymph nodes, angioedema, and breast masses.    Past Medical History  Diagnosis Date  . DJD (degenerative joint disease)   . Anxiety   . Depression   . Insomnia   . Duodenal ulcer   . Bronchiectasis     ct chest 05/09/10  . Systolic heart failure     with ejection fracture of 30%  . Aortic regurgitation     mild to moderate  . Tricuspid regurgitation     mild to moderate    Past Surgical History  Procedure Date  . Left hydrocele   . Hernia repair   . Hemorrhoid surgery   . Total knee  arthroplasty     right  . Appendectomy   . Bilateral cataract repair     Medications:  HOME MEDS: Prior to Admission medications   Medication Sig Start Date End Date Taking? Authorizing Provider  ALPRAZolam Prudy Feeler) 0.5 MG tablet Take 0.25 mg by mouth 4 (four) times daily as needed. Anxiety.   Yes Historical Provider, MD  donepezil (ARICEPT) 10 MG tablet Once a day at bedtime    Yes Historical Provider, MD  finasteride (PROSCAR) 5 MG tablet Once a day    Yes Historical Provider, MD  ibuprofen (ADVIL,MOTRIN) 200 MG tablet Take 200 mg by mouth daily as needed. For knee pain.   Yes Historical Provider, MD  Multiple Vitamin (MULTIVITAMIN) capsule Once a day    Yes Historical Provider, MD  Multiple Vitamins-Minerals (ICAPS PO) Take by mouth. Once a day    Yes Historical Provider, MD  Multiple Vitamins-Minerals (OCUVITE PO) 1 tablet twice a day    Yes Historical Provider, MD  amitriptyline (ELAVIL) 50 MG tablet One tablet four times a day     Historical Provider, MD     Allergies:  Allergies  Allergen Reactions  . Penicillins     REACTION: swelling    Social History:   reports that he has never smoked. He has never used smokeless tobacco. He reports that he does not drink alcohol or use illicit drugs.  Family History: Family History  Problem Relation Age of Onset  . Heart disease Mother      Physical Exam: Filed Vitals:   05/15/11 1811  05/15/11 2152 05/16/11 0107 05/16/11 0130  BP: 137/78 134/67 182/86 157/80  Pulse:  69 78 70  Temp:  97.9 F (36.6 C) 98.5 F (36.9 C) 98.2 F (36.8 C)  TempSrc:  Oral Oral Oral  Resp:  16 17 16   Height:    5\' 8"  (1.727 m)  Weight:    72.6 kg (160 lb 0.9 oz)  SpO2:  99% 100% 100%   Blood pressure 157/80, pulse 70, temperature 98.2 F (36.8 C), temperature source Oral, resp. rate 16, height 5\' 8"  (1.727 m), weight 72.6 kg (160 lb 0.9 oz), SpO2 100.00%.  GEN:  Pleasant  person lying in the stretcher in no acute distress; cooperative with  exam PSYCH:  alert and oriented x4; does not appear anxious does not appear depressed; affect is normal HEENT: Mucous membranes pink and anicteric; PERRLA; EOM intact; no cervical lymphadenopathy nor thyromegaly or carotid bruit; no JVD; Breasts:: Not examined CHEST WALL: No tenderness CHEST: Normal respiration, clear to auscultation bilaterally HEART: Regular rate and rhythm; 2/6 systolic ejection murmur at the left sternal border BACK: No kyphosis or scoliosis; no CVA tenderness ABDOMEN: Obese, soft non-tender; no masses, no organomegaly, normal abdominal bowel sounds; no pannus; no intertriginous candida. Rectal Exam: Not done EXTREMITIES: No bone or joint deformity; age-appropriate arthropathy of the hands and knees; no edema; no ulcerations. Genitalia: not examined PULSES: 2+ and symmetric SKIN: Normal hydration no rash or ulceration, he has a small contusion on his right forehead. CNS: Cranial nerves 2-12 grossly intact no focal neurologic deficit   Labs & Imaging Results for orders placed during the hospital encounter of 05/15/11 (from the past 48 hour(s))  GLUCOSE, CAPILLARY     Status: Abnormal   Collection Time   05/15/11  6:01 PM      Component Value Range Comment   Glucose-Capillary 64 (*) 70 - 99 (mg/dL)   CBC     Status: Abnormal   Collection Time   05/15/11  6:37 PM      Component Value Range Comment   WBC 4.6  4.0 - 10.5 (K/uL)    RBC 4.15 (*) 4.22 - 5.81 (MIL/uL)    Hemoglobin 12.9 (*) 13.0 - 17.0 (g/dL)    HCT 16.1 (*) 09.6 - 52.0 (%)    MCV 89.6  78.0 - 100.0 (fL)    MCH 31.1  26.0 - 34.0 (pg)    MCHC 34.7  30.0 - 36.0 (g/dL)    RDW 04.5  40.9 - 81.1 (%)    Platelets 178  150 - 400 (K/uL)   DIFFERENTIAL     Status: Normal   Collection Time   05/15/11  6:37 PM      Component Value Range Comment   Neutrophils Relative 63  43 - 77 (%)    Neutro Abs 2.9  1.7 - 7.7 (K/uL)    Lymphocytes Relative 23  12 - 46 (%)    Lymphs Abs 1.0  0.7 - 4.0 (K/uL)    Monocytes  Relative 11  3 - 12 (%)    Monocytes Absolute 0.5  0.1 - 1.0 (K/uL)    Eosinophils Relative 3  0 - 5 (%)    Eosinophils Absolute 0.1  0.0 - 0.7 (K/uL)    Basophils Relative 0  0 - 1 (%)    Basophils Absolute 0.0  0.0 - 0.1 (K/uL)   BASIC METABOLIC PANEL     Status: Abnormal   Collection Time   05/15/11  6:37 PM  Component Value Range Comment   Sodium 137  135 - 145 (mEq/L)    Potassium 4.1  3.5 - 5.1 (mEq/L)    Chloride 103  96 - 112 (mEq/L)    CO2 26  19 - 32 (mEq/L)    Glucose, Bld 86  70 - 99 (mg/dL)    BUN 20  6 - 23 (mg/dL)    Creatinine, Ser 1.47  0.50 - 1.35 (mg/dL)    Calcium 9.0  8.4 - 10.5 (mg/dL)    GFR calc non Af Amer 65 (*) >90 (mL/min)    GFR calc Af Amer 75 (*) >90 (mL/min)   URINALYSIS, ROUTINE W REFLEX MICROSCOPIC     Status: Abnormal   Collection Time   05/15/11  6:52 PM      Component Value Range Comment   Color, Urine YELLOW  YELLOW     APPearance CLEAR  CLEAR     Specific Gravity, Urine 1.017  1.005 - 1.030     pH 7.0  5.0 - 8.0     Glucose, UA NEGATIVE  NEGATIVE (mg/dL)    Hgb urine dipstick MODERATE (*) NEGATIVE     Bilirubin Urine NEGATIVE  NEGATIVE     Ketones, ur TRACE (*) NEGATIVE (mg/dL)    Protein, ur NEGATIVE  NEGATIVE (mg/dL)    Urobilinogen, UA 0.2  0.0 - 1.0 (mg/dL)    Nitrite NEGATIVE  NEGATIVE     Leukocytes, UA NEGATIVE  NEGATIVE    URINE MICROSCOPIC-ADD ON     Status: Abnormal   Collection Time   05/15/11  6:52 PM      Component Value Range Comment   Squamous Epithelial / LPF FEW (*) RARE     RBC / HPF 11-20  <3 (RBC/hpf)    Bacteria, UA FEW (*) RARE     Urine-Other MUCOUS PRESENT     GLUCOSE, CAPILLARY     Status: Abnormal   Collection Time   05/15/11  7:30 PM      Component Value Range Comment   Glucose-Capillary 50 (*) 70 - 99 (mg/dL)   GLUCOSE, CAPILLARY     Status: Abnormal   Collection Time   05/15/11  9:03 PM      Component Value Range Comment   Glucose-Capillary 172 (*) 70 - 99 (mg/dL)   GLUCOSE, CAPILLARY     Status:  Abnormal   Collection Time   05/16/11  1:42 AM      Component Value Range Comment   Glucose-Capillary 43 (*) 70 - 99 (mg/dL)   GLUCOSE, CAPILLARY     Status: Normal   Collection Time   05/16/11  2:14 AM      Component Value Range Comment   Glucose-Capillary 85  70 - 99 (mg/dL)    Dg Chest 2 View  10/12/9560  *RADIOLOGY REPORT*  Clinical Data: Fall.  Chest pain.  CHEST - 2 VIEW  Comparison: Multiple exams, including 11/22/2010  Findings: Prominent tortuous and ectatic thoracic aorta once again noted.  This appears stable.  Moderate cardiomegaly is present.  No pneumothorax is observed.  Stable interstitial accentuation noted, particularly in the lung bases, with associated airway thickening and bronchiectasis.  No pleural effusion is observed.  Prior sternal and rib fractures are noted.  Acute refracture of a left lower lateral rib cannot be excluded.  There is scarring or subsegmental atelectasis along the hemidiaphragms.  IMPRESSION:  1.  Airway thickening and bronchiectasis at the lung bases.  This appears chronic. 2.  Old sternal  and rib fractures.  There may be an acute refracture of a left lower lateral rib. 3.  Cardiomegaly, chronic. 4.  Chronic tortuosity and ectasia of the thoracic aorta.  Original Report Authenticated By: Dellia Cloud, M.D.   Ct Head Wo Contrast  05/15/2011  *RADIOLOGY REPORT*  Clinical Data: Facial trauma secondary to a fall due to syncope. Multiple recent falls.  CT HEAD WITHOUT CONTRAST  Technique:  Contiguous axial images were obtained from the base of the skull through the vertex without contrast.  Comparison: None.  Findings: There is no acute intracranial hemorrhage, infarction, or mass lesion.  There is diffuse mild cerebral cortical and cerebellar atrophy with slight periventricular white matter lucency consistent with chronic small vessel ischemic disease, unchanged. No acute osseous abnormalities.  IMPRESSION:  No acute abnormalities.  No change since the prior  study of 02/11/2007.  Original Report Authenticated By: Gwynn Burly, M.D.   Ct Cervical Spine Wo Contrast  05/15/2011  *RADIOLOGY REPORT*  Clinical Data: Right face bruising and swelling secondary to trauma from a fall due to syncope.  Multiple recent falls.  CT CERVICAL SPINE WITHOUT CONTRAST  Technique:  Multidetector CT imaging of the cervical spine was performed. Multiplanar CT image reconstructions were also generated.  Comparison: Radiographs dated 02/26/2004  Findings: There is no fracture, acute subluxation, prevertebral soft tissue swelling, or other acute abnormality.  There is reversal of the cervical lordosis with moderate to severe degenerative disc disease at C4-5, C5-6, and C6-7.  There is moderate facet arthritis bilaterally at C2-3 through C4-5 and on the right at the C7-T1.  No visible spinal cord compression.  Impression:  No acute abnormality of the cervical spine. Multilevel degenerative disc and joint disease.  Original Report Authenticated By: Gwynn Burly, M.D.   Dg Hand Complete Right  05/15/2011  *RADIOLOGY REPORT*  Clinical Data: Fall.  Right hand bruising posteriorly.  RIGHT HAND - COMPLETE 3+ VIEW  Comparison: None.  Findings: Osteoarthritis noted.  No discrete fracture or foreign body is observed.  IMPRESSION:  1.  Osteoarthritis.  No acute findings.  Original Report Authenticated By: Dellia Cloud, M.D.   Ct Maxillofacial Wo Cm  05/15/2011  *RADIOLOGY REPORT*  Clinical Data: Facial trauma secondary to a fall due to syncope.  CT MAXILLOFACIAL WITHOUT CONTRAST  Technique:  Multidetector CT imaging of the maxillofacial structures was performed. Multiplanar CT image reconstructions were also generated.  Comparison: None.  Findings: There is slight soft tissue swelling over the right cheek.  There is no acute osseous abnormality.  Paranasal sinuses are clear.  Chronic partial opacification of the mastoid air cells and right middle ear cavity.  Left submandibular gland  appears to have been removed.  Right submandibular gland is normal.  IMPRESSION: Slight soft tissue contusion over the right cheek.  No other acute abnormalities.  Original Report Authenticated By: Gwynn Burly, M.D.      Assessment Present on Admission:  .Hypoglycemia .Contusion .DJD (degenerative joint disease) Frequent falls Diabetes2    PLAN: I'm not sure why he has frequent falls. It's possible that it is because of hypoglycemia, but which should have associated with diaphoresis etc. More likely however, is that he has autonomic dysfunction from his diabetes. We'll admit him to telemetry follow his blood glucose carefully, and get physical therapy to evaluate him for gait stability. He is otherwise stable, full code, and will be admitted to triad hospitalist service.   Other plans as per orders.    Kalina Morabito 05/16/2011,  3:48 AM

## 2011-05-16 NOTE — Evaluation (Signed)
Physical Therapy Evaluation Patient Details Name: MAKHAI FULCO MRN: [id number] DOB: [birth year] Today's Date: 05/16/2011  Problem List:  Patient Active Problem List  Diagnoses  . Fall at home  . Hypoglycemia  . CHF (congestive heart failure)  . Contusion  . DJD (degenerative joint disease)    Past Medical History:  Past Medical History  Diagnosis Date  . DJD (degenerative joint disease)   . Anxiety   . Depression   . Insomnia   . Duodenal ulcer   . Bronchiectasis     ct chest 05/09/10  . Systolic heart failure     with ejection fracture of 30%  . Aortic regurgitation     mild to moderate  . Tricuspid regurgitation     mild to moderate   Past Surgical History:  Past Surgical History  Procedure Date  . Left hydrocele   . Hernia repair   . Hemorrhoid surgery   . Total knee arthroplasty     right  . Appendectomy   . Bilateral cataract repair     PT Assessment/Plan/Recommendation PT Assessment Clinical Impression Statement: Pt admitted for recurrent falls.  Pt reports he does not know cause of falls.  Pt would benefit from acute PT services in order to improve safety and balance for d/c home if 24/7 supervision available.  Recommend  HHPT for home safety eval and balance training. PT Recommendation/Assessment: Patient will need skilled PT in the acute care venue PT Problem List: Decreased balance;Decreased knowledge of use of DME;Decreased safety awareness PT Therapy Diagnosis : Difficulty walking (poor balance, recurrent falls) PT Plan PT Frequency: Min 3X/week PT Treatment/Interventions: DME instruction;Gait training;Functional mobility training;Therapeutic exercise;Balance training;Therapeutic activities;Patient/family education;Neuromuscular re-education PT Recommendation Follow Up Recommendations: Home health PT;Supervision for mobility/OOB Equipment Recommended: None recommended by PT PT Goals  Acute Rehab PT Goals PT Goal Formulation: With patient Time  For Goal Achievement: 7 days Pt will go Sit to Stand: with modified independence PT Goal: Sit to Stand - Progress: Goal set today Pt will go Stand to Sit: with modified independence PT Goal: Stand to Sit - Progress: Goal set today Pt will Ambulate: >150 feet;with modified independence;with least restrictive assistive device PT Goal: Ambulate - Progress: Goal set today Additional Goals Additional Goal #1: Pt will demonstrate improved balance by being modified independent with start/stop, turning 360 degrees in less than 4 seconds, and reaching for object outside BOS with RW without LOB. PT Goal: Additional Goal #1 - Progress: Goal set today  PT Evaluation Precautions/Restrictions  Precautions Precautions: Fall Restrictions Weight Bearing Restrictions: No Prior Functioning  Home Living Lives With: Alone Type of Home: House Home Layout: One level Bathroom Shower/Tub: Tub/shower unit Home Adaptive Equipment: Quad cane;Grab bars in shower;Walker - rolling Prior Function Level of Independence: Requires assistive device for independence;Independent with basic ADLs Cognition Cognition Arousal/Alertness: Awake/alert Overall Cognitive Status: Appears within functional limits for tasks assessed Orientation Level: Oriented X4 Sensation/Coordination Sensation Light Touch: Appears Intact Extremity Assessment RLE Assessment RLE Assessment: Within Functional Limits LLE Assessment LLE Assessment: Within Functional Limits Mobility (including Balance) Bed Mobility Bed Mobility: Yes Sit to Supine: 6: Modified independent (Device/Increase time) Transfers Transfers: Yes Sit to Stand: 4: Min assist;With upper extremity assist;From chair/3-in-1 Sit to Stand Details (indicate cue type and reason): min/guard, verbal cues for hand placement Stand to Sit: To bed;With upper extremity assist;4: Min assist Stand to Sit Details: min/guard, verbal cues for hand  placement Ambulation/Gait Ambulation/Gait: Yes Ambulation/Gait Assistance: 4: Min assist Ambulation/Gait Assistance Details (indicate cue type  and reason): min/guard, pt performed well with RW with no LOB, pt also performed head turns fo 50 feet without balance disruptions, moderate cues for keeping RW close to body Ambulation Distance (Feet): 240 Feet Assistive device: Rolling walker Gait Pattern: Step-through pattern;Decreased stride length  Balance Balance Assessed: Yes Static Standing Balance Static Standing - Balance Support: No upper extremity supported Static Standing - Comment/# of Minutes: performed trunk perturbations and pt with poor righting reactions and no stepping reaction present, pt would reach out with UE for RW with challenge Single Leg Stance - Right Leg: 7  Single Leg Stance - Left Leg: 3  Exercise    End of Session PT - End of Session Activity Tolerance: Patient tolerated treatment well Patient left: in bed;with call bell in reach General Behavior During Session: [facility] for tasks performed Cognition: [facility] for tasks performed  Sedrick Tober,KATHrine E 05/16/2011, 1:47 PM Pager: [phone]

## 2011-05-16 NOTE — Progress Notes (Signed)
CARE MANAGEMENT NOTE 05/16/2011  Patient:  Russell Fisher, Russell Fisher   Account Number:  [id number]  Date Initiated:  05/16/2011  Documentation initiated by:  Soyla Bainter  Subjective/Objective Assessment:   multiple fall at the home, cause ataxia or other source,     Action/Plan:   lives at home   Anticipated DC Date:  05/19/2011   Anticipated DC Plan:  HOME/SELF CARE  In-house referral  NA  NA      DC Planning Services  NA      PAC Choice  NA   Choice offered to / List presented to:  NA   DME arranged  NA      DME agency  NA     HH arranged  NA      HH agency  NA   Status of service:  In process, will continue to follow Medicare Important Message given?  NA - LOS <3 / Initial given by admissions (If response is "NO", the following Medicare IM given date fields will be blank) Date Medicare IM given:   Date Additional Medicare IM given:    Discharge Disposition:    Per UR Regulation:  Reviewed for med. necessity/level of care/duration of stay  Comments:  [id number]/Keyra Virella,RN,BSN,CCM

## 2011-05-17 LAB — GLUCOSE, CAPILLARY: Glucose-Capillary: 112 mg/dL — ABNORMAL HIGH (ref 70–99)

## 2011-05-17 MED ORDER — AMITRIPTYLINE HCL 100 MG PO TABS: 100.0000 mg | ORAL_TABLET | Freq: Every day | ORAL | Status: DC

## 2011-05-17 MED ORDER — CLONAZEPAM 0.125 MG PO TBDP: 0.1250 mg | ORAL_TABLET | Freq: Two times a day (BID) | ORAL | Status: DC

## 2011-05-17 NOTE — Progress Notes (Signed)
Patient seen and examined. Chart reviewed. Agree with Toya Smothers, NP.  78 year old gentleman with history of degenerative joint disease, chronic systolic congestive heart failure with an ejection fraction of 30% compensated with mild to moderate AI and TR, diabetes he presented on 05/16/2011 with frequent falls.  Infectious etiology was negative with negative urine analysis.  Chest x-ray did not show any acute changes, chest x-ray showed old sternal and rib fractures, but there was a question of acute left lower lateral rib fracture.  Head CT was negative.  No events were noted on telemetry.  Patient's frequent falls at home were thought to be due to polypharmacy, patient's amitriptyline dose was changed only to each bedtime.  Xanax was discontinued and patient was started on clonazepam.  Patient was evaluated by physical therapy and occupational therapy.  Occupational therapy recommended short-term skilled nursing facility, however patient declined as a result arrangements were made for home health PT/OT/RN/social worker.  Patient was deemed stable for discharge today.  Please refer to the above discharge summary for more details.  Kimber Fritts A, MD 05/17/2011, 4:15 PM

## 2011-05-17 NOTE — Discharge Instructions (Signed)
Fall Prevention   Falls cause injuries and can affect all age groups. It is possible to prevent falls.   HOME CARE   Wear shoes with non-slip soles (not slippers).   Have your home and outside area well lit.   Use night lights throughout your home.   Remove clutter.   Clean up floor spills.   Remove throw rugs or fasten them to the floor with carpet tape.   Do not place electrical cords across pathways.   Put grab bars by your tub, shower, and toilet. Do not use towel bars as grab bars.   Put handrails on both sides of the stairway.   Do not climb on stools or stepladders.   Do not wax your floors.   Repair uneven or unsafe sidewalks, walkways, or stairs.   Keep items you use a lot within reach.   Be aware of pets.   Change positions slowly.   Sit up on the side of your bed for a few minutes before standing. This prevents dizziness.   Keep a bedside commode by your bed at night.   If you have a cane or walker, be sure to use it whenever you are walking.   Have your eyes and hearing checked every year.  Ask your doctor what other things you can do to prevent falls.  GET HELP RIGHT AWAY IF:   You feel dizzy, weak, or unsteady on your feet.   You feel confused.   You fall and hurt yourself.  Document Released: 12/24/2008 Document Revised: 02/16/2011 Document Reviewed: 12/24/2008  Exit[employer] Patient Information 2012 [employer].

## 2011-05-17 NOTE — Evaluation (Signed)
Occupational Therapy Evaluation Patient Details Name: Russell Fisher MRN: [id number] DOB: 05/12/1920 Today's Date: 05/17/2011  Problem List:  Patient Active Problem List  Diagnoses  . Fall at home  . Hypoglycemia  . CHF (congestive heart failure)  . Contusion  . DJD (degenerative joint disease)    Past Medical History:  Past Medical History  Diagnosis Date  . DJD (degenerative joint disease)   . Anxiety   . Depression   . Insomnia   . Duodenal ulcer   . Bronchiectasis     ct chest 05/09/10  . Systolic heart failure     with ejection fracture of 30%  . Aortic regurgitation     mild to moderate  . Tricuspid regurgitation     mild to moderate   Past Surgical History:  Past Surgical History  Procedure Date  . Left hydrocele   . Hernia repair   . Hemorrhoid surgery   . Total knee arthroplasty     right  . Appendectomy   . Bilateral cataract repair     OT Assessment/Plan/Recommendation OT Assessment Clinical Impression Statement: This 78 year old male was admitted with recurrent falls.  POA states CBG was 50 when he was admitted.  Pt was independent with basic ADLs and had help for IADLs.  He is currently min guard to ambulate.  Pt will not consider STSNF.  Pt feels he hasn't fallen much, but he still demonstrated 1 episode of unsteadiness walking to bathroom.  Feel he would benefit from skilled OT with supervision goals in acute.   OT Recommendation/Assessment: Patient will need skilled OT in the acute care venue OT Problem List: Decreased strength;Decreased activity tolerance;Impaired balance (sitting and/or standing);Decreased safety awareness Barriers to Discharge: Decreased caregiver support OT Therapy Diagnosis : Generalized weakness OT Plan OT Frequency: Min 2X/week OT Treatment/Interventions: Self-care/ADL training;Energy conservation;Therapeutic activities;Balance training;Patient/family education OT Recommendation Follow Up Recommendations: Home health  OT;Other (comment) (Pt would benefit from STSNF and feel this is safest option, but pt will not consider ) Equipment Recommended: Other (comment) (defer to HHOT--tub DME) Individuals Consulted Consulted and Agree with Results and Recommendations: Patient;Family member/caregiver Family Member Consulted: POA present OT Goals Acute Rehab OT Goals OT Goal Formulation: With patient Time For Goal Achievement: 2 weeks ADL Goals Pt Will Transfer to Toilet: with supervision;Regular height toilet;Grab bars ADL Goal: Toilet Transfer - Progress: Goal set today Miscellaneous OT Goals Miscellaneous OT Goal #1: pt will gather adl supplies and clothes with supervision with RW OT Goal: Miscellaneous Goal #1 - Progress: Goal set today  OT Evaluation Precautions/Restrictions  Precautions Precautions: Fall Restrictions Weight Bearing Restrictions: No Prior Functioning Home Living Lives With: Alone Receives Help From: Friend(s) (POA nearby helps with meals; also has girl help) Bathroom Shower/Tub: Tub/shower unit;Curtain;Other (comment) (grab bar) Bathroom Toilet: Standard (with grab bar) Prior Function Level of Independence: Requires assistive device for independence ADL ADL Grooming: Simulated;Supervision/safety Where Assessed - Grooming: Standing at sink Upper Body Bathing: Simulated;Set up Upper Body Bathing Details (indicate cue type and reason): min guard to walk and gather supplies Where Assessed - Upper Body Bathing: Unsupported;Sitting, bed Lower Body Bathing: Performed;Simulated;Supervision/safety (performed socks) Where Assessed - Lower Body Bathing: Sit to stand from bed Upper Body Dressing: Simulated;Set up Where Assessed - Upper Body Dressing: Sitting, bed;Unsupported Lower Body Dressing: Set up;Simulated;Performed (socks performed. ) Lower Body Dressing Details (indicate cue type and reason): min guard to gather supplies Where Assessed - Lower Body Dressing: Sit to stand from  bed Toilet Transfer: Performed;Minimal assistance (  min guard) Toilet Transfer Details (indicate cue type and reason): pt had 1 slight LOB which he recovered from on his own Toilet Transfer Method: Ambulating;Other (comment) (with RW) Toilet Transfer Equipment: Comfort height toilet;Grab bars Toileting - Clothing Manipulation: Simulated;Supervision/safety Where Assessed - Toileting Clothing Manipulation: Sit to stand from 3-in-1 or toilet Toileting - Hygiene: Simulated;Supervision/safety Where Assessed - Toileting Hygiene: Sit to stand from 3-in-1 or toilet Equipment Used: Rolling walker Ambulation Related to ADLs: min guard with RW:  one slight LOB which he recovered himself ADL Comments: POA in room; brings meals.  Pt is not agreeable to STSNF Vision/Perception  Vision - History Baseline Vision: No visual deficits Cognition Cognition Orientation Level: Oriented to person;Oriented to place;Oriented to situation;Oriented to time Sensation/Coordination   Extremity Assessment RUE Assessment RUE Assessment: Within Functional Limits LUE Assessment LUE Assessment: Within Functional Limits Mobility  Bed Mobility Bed Mobility: Yes Sit to Supine: 5: Supervision (min cues to roll over; pt unable to get up with abdominals without rail) Transfers Sit to Stand: 5: Supervision;With upper extremity assist;From bed;From toilet Exercises   End of Session OT - End of Session Equipment Utilized During Treatment: Gait belt Activity Tolerance: Patient limited by fatigue Patient left: in bed;with bed alarm set;with family/visitor present General Behavior During Session: [facility] for tasks performed Cognition: [facility] for tasks performed (but pt doesn't see limitations of balance and endurance) Russell Fisher, OTR/L [phone] 05/17/2011  Russell Fisher 05/17/2011, 2:12 PM

## 2011-05-17 NOTE — Progress Notes (Signed)
Spoke with pt concerning discharge plan and needs.  Pt selected [facility] for Home Health. Referral given to in house rep. mp

## 2011-05-17 NOTE — Progress Notes (Signed)
Physician Discharge Summary  Patient ID: Russell Fisher MRN: [id number] DOB/AGE: December 03, 1920 78 y.o.  Admit date: 05/15/2011 Discharge date: 05/17/2011  Primary Care Physician:  Lupita Raider, MD, MD   Discharge Diagnoses:    Active Problems:  Fall at home  Hypoglycemia  CHF (congestive heart failure)  Contusion  DJD (degenerative joint disease)   Medication List  As of 05/17/2011  3:08 PM   STOP taking these medications         ALPRAZolam 0.25 MG tablet      ALPRAZolam 0.5 MG tablet         TAKE these medications         amitriptyline 100 MG tablet   Commonly known as: ELAVIL   Take 1 tablet (100 mg total) by mouth at bedtime.      clonazepam 0.125 MG disintegrating tablet   Commonly known as: KLONOPIN   Take 1 tablet (0.125 mg total) by mouth 2 (two) times daily.      donepezil 10 MG tablet   Commonly known as: ARICEPT   Once a day at bedtime      ibuprofen 200 MG tablet   Commonly known as: ADVIL,MOTRIN   Take 200 mg by mouth daily as needed. For knee pain.      ICAPS PO   Take by mouth. Once a day      OCUVITE PO   1 tablet twice a day      multivitamin capsule   Once a day      PROSCAR 5 MG tablet   Generic drug: finasteride   Once a day             Disposition and Follow-up: pt is medically stable and ready for discharge to home. Will have home health Rn/PT/OT and Child psychotherapist. 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 Sitting up in chair eating breakfast/watching TV  HEENT: No bruits, no goiter. Mucus membranes moist/pink. Ill fitting dentures. VERY HOH PERRL Bruise over right cheek  Heart: Regular rate and rhythm, + murmurs, rubs, gallops.  Lungs: Normal effort. Breath sounds clear to auscultation bilaterally. No wheeze  Abdomen: Obese, Soft, nontender, nondistended, positive bowel sounds.  Extremities: No clubbing  cyanosis or edema with positive pedal pulses.  Neuro: Grossly intact, nonfocal. Speech clear. Cranial nerve II-XII intact       Significant Diagnostic Studies:  Dg Chest 2 View  05/15/2011  *RADIOLOGY REPORT*  Clinical Data: Fall.  Chest pain.  CHEST - 2 VIEW  Comparison: Multiple exams, including 11/22/2010  Findings: Prominent tortuous and ectatic thoracic aorta once again noted.  This appears stable.  Moderate cardiomegaly is present.  No pneumothorax is observed.  Stable interstitial accentuation noted, particularly in the lung bases, with associated airway thickening and bronchiectasis.  No pleural effusion is observed.  Prior sternal and rib fractures are noted.  Acute refracture of a left lower lateral rib cannot be excluded.  There is scarring or subsegmental atelectasis along the hemidiaphragms.  IMPRESSION:  1.  Airway thickening and bronchiectasis at the lung bases.  This appears chronic. 2.  Old sternal and rib fractures.  There may be an acute refracture of a left lower lateral rib. 3.  Cardiomegaly, chronic. 4.  Chronic tortuosity and ectasia of the thoracic aorta.  Original Report Authenticated By: Dellia Cloud, M.D.   Ct Head Wo Contrast  05/15/2011  *RADIOLOGY REPORT*  Clinical Data: Facial trauma secondary to a fall due to syncope. Multiple recent falls.  CT HEAD WITHOUT CONTRAST  Technique:  Contiguous axial images were obtained from the base of the skull through the vertex without contrast.  Comparison: None.  Findings: There is no acute intracranial hemorrhage, infarction, or mass lesion.  There is diffuse mild cerebral cortical and cerebellar atrophy with slight periventricular white matter lucency consistent with chronic small vessel ischemic disease, unchanged. No acute osseous abnormalities.  IMPRESSION:  No acute abnormalities.  No change since the prior study of 02/11/2007.  Original Report Authenticated By: Gwynn Burly, M.D.   Ct Cervical Spine Wo  Contrast  05/15/2011  *RADIOLOGY REPORT*  Clinical Data: Right face bruising and swelling secondary to trauma from a fall due to syncope.  Multiple recent falls.  CT CERVICAL SPINE WITHOUT CONTRAST  Technique:  Multidetector CT imaging of the cervical spine was performed. Multiplanar CT image reconstructions were also generated.  Comparison: Radiographs dated 02/26/2004  Findings: There is no fracture, acute subluxation, prevertebral soft tissue swelling, or other acute abnormality.  There is reversal of the cervical lordosis with moderate to severe degenerative disc disease at C4-5, C5-6, and C6-7.  There is moderate facet arthritis bilaterally at C2-3 through C4-5 and on the right at the C7-T1.  No visible spinal cord compression.  Impression:  No acute abnormality of the cervical spine. Multilevel degenerative disc and joint disease.  Original Report Authenticated By: Gwynn Burly, M.D.   Dg Hand Complete Right  05/15/2011  *RADIOLOGY REPORT*  Clinical Data: Fall.  Right hand bruising posteriorly.  RIGHT HAND - COMPLETE 3+ VIEW  Comparison: None.  Findings: Osteoarthritis noted.  No discrete fracture or foreign body is observed.  IMPRESSION:  1.  Osteoarthritis.  No acute findings.  Original Report Authenticated By: Dellia Cloud, M.D.   Ct Maxillofacial Wo Cm  05/15/2011  *RADIOLOGY REPORT*  Clinical Data: Facial trauma secondary to a fall due to syncope.  CT MAXILLOFACIAL WITHOUT CONTRAST  Technique:  Multidetector CT imaging of the maxillofacial structures was performed. Multiplanar CT image reconstructions were also generated.  Comparison: None.  Findings: There is slight soft tissue swelling over the right cheek.  There is no acute osseous abnormality.  Paranasal sinuses are clear.  Chronic partial opacification of the mastoid air cells and right middle ear cavity.  Left submandibular gland appears to have been removed.  Right submandibular gland is normal.  IMPRESSION: Slight soft tissue  contusion over the right cheek.  No other acute abnormalities.  Original Report Authenticated By: Gwynn Burly, M.D.    Labs Reviewed  CBC - Abnormal; Notable for the following:    RBC 4.15 (*)    Hemoglobin 12.9 (*)    HCT 37.2 (*)    All other components within normal limits  BASIC METABOLIC PANEL - Abnormal; Notable for the following:    GFR calc non Af Amer 65 (*)    GFR calc Af Amer 75 (*)    All other components within normal limits  URINALYSIS, ROUTINE W REFLEX MICROSCOPIC - Abnormal; Notable for the following:    Hgb urine dipstick MODERATE (*)    Ketones, ur TRACE (*)    All other components within normal limits  GLUCOSE, CAPILLARY - Abnormal; Notable for the following:    Glucose-Capillary 64 (*)    All other components within normal limits  URINE MICROSCOPIC-ADD ON - Abnormal; Notable for the following:  Squamous Epithelial / LPF FEW (*)    Bacteria, UA FEW (*)    All other components within normal limits  GLUCOSE, CAPILLARY - Abnormal; Notable for the following:    Glucose-Capillary 50 (*)    All other components within normal limits  GLUCOSE, CAPILLARY - Abnormal; Notable for the following:    Glucose-Capillary 172 (*)    All other components within normal limits  BASIC METABOLIC PANEL - Abnormal; Notable for the following:    Glucose, Bld 107 (*)    GFR calc non Af Amer 76 (*)    GFR calc Af Amer 88 (*)    All other components within normal limits  CBC - Abnormal; Notable for the following:    HCT 37.8 (*)    All other components within normal limits  GLUCOSE, CAPILLARY - Abnormal; Notable for the following:    Glucose-Capillary 43 (*)    All other components within normal limits  GLUCOSE, CAPILLARY - Abnormal; Notable for the following:    Glucose-Capillary 100 (*)    All other components within normal limits  GLUCOSE, CAPILLARY - Abnormal; Notable for the following:    Glucose-Capillary 113 (*)    All other components within normal limits  GLUCOSE,  CAPILLARY - Abnormal; Notable for the following:    Glucose-Capillary 104 (*)    All other components within normal limits  GLUCOSE, CAPILLARY - Abnormal; Notable for the following:    Glucose-Capillary 112 (*)    All other components within normal limits  DIFFERENTIAL  URINE CULTURE  GLUCOSE, CAPILLARY  TSH  VITAMIN B12  RPR  GLUCOSE, CAPILLARY  C-PEPTIDE  INSULIN ANTIBODIES, BLOOD        Dg Chest 2 View  05/15/2011  *RADIOLOGY REPORT*  Clinical Data: Fall.  Chest pain.  CHEST - 2 VIEW  Comparison: Multiple exams, including 11/22/2010  Findings: Prominent tortuous and ectatic thoracic aorta once again noted.  This appears stable.  Moderate cardiomegaly is present.  No pneumothorax is observed.  Stable interstitial accentuation noted, particularly in the lung bases, with associated airway thickening and bronchiectasis.  No pleural effusion is observed.  Prior sternal and rib fractures are noted.  Acute refracture of a left lower lateral rib cannot be excluded.  There is scarring or subsegmental atelectasis along the hemidiaphragms.  IMPRESSION:  1.  Airway thickening and bronchiectasis at the lung bases.  This appears chronic. 2.  Old sternal and rib fractures.  There may be an acute refracture of a left lower lateral rib. 3.  Cardiomegaly, chronic. 4.  Chronic tortuosity and ectasia of the thoracic aorta.  Original Report Authenticated By: Dellia Cloud, M.D.   Ct Head Wo Contrast  05/15/2011  *RADIOLOGY REPORT*  Clinical Data: Facial trauma secondary to a fall due to syncope. Multiple recent falls.  CT HEAD WITHOUT CONTRAST  Technique:  Contiguous axial images were obtained from the base of the skull through the vertex without contrast.  Comparison: None.  Findings: There is no acute intracranial hemorrhage, infarction, or mass lesion.  There is diffuse mild cerebral cortical and cerebellar atrophy with slight periventricular white matter lucency consistent with chronic small vessel  ischemic disease, unchanged. No acute osseous abnormalities.  IMPRESSION:  No acute abnormalities.  No change since the prior study of 02/11/2007.  Original Report Authenticated By: Gwynn Burly, M.D.   Ct Cervical Spine Wo Contrast  05/15/2011  *RADIOLOGY REPORT*  Clinical Data: Right face bruising and swelling secondary to trauma from a fall due to  syncope.  Multiple recent falls.  CT CERVICAL SPINE WITHOUT CONTRAST  Technique:  Multidetector CT imaging of the cervical spine was performed. Multiplanar CT image reconstructions were also generated.  Comparison: Radiographs dated 02/26/2004  Findings: There is no fracture, acute subluxation, prevertebral soft tissue swelling, or other acute abnormality.  There is reversal of the cervical lordosis with moderate to severe degenerative disc disease at C4-5, C5-6, and C6-7.  There is moderate facet arthritis bilaterally at C2-3 through C4-5 and on the right at the C7-T1.  No visible spinal cord compression.  Impression:  No acute abnormality of the cervical spine. Multilevel degenerative disc and joint disease.  Original Report Authenticated By: Gwynn Burly, M.D.   Dg Hand Complete Right  05/15/2011  *RADIOLOGY REPORT*  Clinical Data: Fall.  Right hand bruising posteriorly.  RIGHT HAND - COMPLETE 3+ VIEW  Comparison: None.  Findings: Osteoarthritis noted.  No discrete fracture or foreign body is observed.  IMPRESSION:  1.  Osteoarthritis.  No acute findings.  Original Report Authenticated By: Dellia Cloud, M.D.   Ct Maxillofacial Wo Cm  05/15/2011  *RADIOLOGY REPORT*  Clinical Data: Facial trauma secondary to a fall due to syncope.  CT MAXILLOFACIAL WITHOUT CONTRAST  Technique:  Multidetector CT imaging of the maxillofacial structures was performed. Multiplanar CT image reconstructions were also generated.  Comparison: None.  Findings: There is slight soft tissue swelling over the right cheek.  There is no acute osseous abnormality.  Paranasal  sinuses are clear.  Chronic partial opacification of the mastoid air cells and right middle ear cavity.  Left submandibular gland appears to have been removed.  Right submandibular gland is normal.  IMPRESSION: Slight soft tissue contusion over the right cheek.  No other acute abnormalities.  Original Report Authenticated By: Gwynn Burly, M.D.       Brief H and P: For complete details please refer to admission H and P, but in brief  Russell Fisher is an 78 y.o. male with history of DJD, status post right total knee replacement, congestive heart failure with ejection fraction of 30%, mild to moderate AI and TR, , who had been falling for the past year and a half, but increased frequency this past 3 days presents to [facility] ED 05/16/11. He denied loss of consciousness, palpitations, warning symptoms, vertigo, orthostatic symptomology, chest pain or shortness of breath, fever or chills. He did not exactly state that he tripped and fell. He really does not know why he fell. He denied leg weakness, knees giving way, or having any pain. Evaluation in the emergency room included a blood glucose of 50, normal creatinine, negative UA except he had hematuria, negative CT of his brain and cervical spine. With his fall, he suffered a contusion on his forehead.  Hospitalist was asked to admit him for further evaluation. He lives alone    Hospital Course:   Active Problems:  Fall at home  Hypoglycemia  CHF (congestive heart failure)  Contusion  DJD (degenerative joint disease) Active Problems:  1. Fall at home: etiology unclear. Admitted to tele. CT head and chest xray as above. Initially concern  related to hypoglycemia however this quickly resolved. On further evaluation, suspect falls related to polypharmacy. Home meds changed. Elavil changed to qhs only at half home dose. Klonipin started as Xanax discontinued.   TSH, RPR, VIT B12 WNL. Sitting to standing SBP drop from 153-137 so not quite orthostatic.  Tele remained in SR. Pt evaluated by PT/OT recommended facility but  pt declined. Will set up [facility]. Pt has appointment PCP 3/12 /13 for follow up  2. Hypoglycemia: D5NS for 1 L. Glucose quickly resolved. At time of discharge CBG 104-114.   3. CHF (congestive heart failure): chronic systolic. EF 30%. Maintained on  strict I&O's daily weights. Remained compensated during hospitalization 4.Contusion : CT as above. At time of discharge swelling subsiding.  5.DJD (degenerative joint disease) at baseline. PT for gait assessment. Will have home PT.       Time spent on Discharge:  Signed: Gwenyth Bender 05/17/2011, 3:08 PM

## 2011-05-18 LAB — GLUCOSE, CAPILLARY: Glucose-Capillary: 118 mg/dL — ABNORMAL HIGH (ref 70–99)

## 2011-05-22 LAB — INSULIN ANTIBODIES, BLOOD: Insulin Antibodies, Human: <0.4 U/mL (ref <0.4)

## 2011-07-01 ENCOUNTER — Emergency Department (HOSPITAL_COMMUNITY): Payer: Medicare Other

## 2011-07-01 ENCOUNTER — Emergency Department (HOSPITAL_COMMUNITY)
Admission: EM | Admit: 2011-07-01 | Discharge: 2011-07-02 | Disposition: A | Discharge status: Home / Self Care | Payer: Medicare Other | Attending: Emergency Medicine | Admitting: Emergency Medicine

## 2011-07-01 ENCOUNTER — Encounter (HOSPITAL_COMMUNITY): Payer: Self-pay | Admitting: *Deleted

## 2011-07-01 DIAGNOSIS — R296 Repeated falls: Secondary | ICD-10-CM | POA: Insufficient documentation

## 2011-07-01 DIAGNOSIS — R109 Unspecified abdominal pain: Secondary | ICD-10-CM | POA: Insufficient documentation

## 2011-07-01 DIAGNOSIS — M549 Dorsalgia, unspecified: Secondary | ICD-10-CM | POA: Insufficient documentation

## 2011-07-01 DIAGNOSIS — N390 Urinary tract infection, site not specified: Secondary | ICD-10-CM | POA: Insufficient documentation

## 2011-07-01 DIAGNOSIS — K5900 Constipation, unspecified: Secondary | ICD-10-CM | POA: Insufficient documentation

## 2011-07-01 LAB — URINALYSIS, ROUTINE W REFLEX MICROSCOPIC
Bilirubin Urine: NEGATIVE
Glucose, UA: NEGATIVE mg/dL
Hgb urine dipstick: NEGATIVE
Protein, ur: NEGATIVE mg/dL
Urobilinogen, UA: 1 mg/dL (ref 0.0–1.0)

## 2011-07-01 LAB — CBC
HCT: 37.2 % — ABNORMAL LOW (ref 39.0–52.0)
Hemoglobin: 12.6 g/dL — ABNORMAL LOW (ref 13.0–17.0)
MCH: 30.1 pg (ref 26.0–34.0)
MCHC: 33.9 g/dL (ref 30.0–36.0)
MCV: 88.8 fL (ref 78.0–100.0)
RBC: 4.19 MIL/uL — ABNORMAL LOW (ref 4.22–5.81)

## 2011-07-01 LAB — BASIC METABOLIC PANEL
BUN: 19 mg/dL (ref 6–23)
CO2: 25 mEq/L (ref 19–32)
Chloride: 97 mEq/L (ref 96–112)
Glucose, Bld: 98 mg/dL (ref 70–99)
Potassium: 4.5 mEq/L (ref 3.5–5.1)
Sodium: 132 mEq/L — ABNORMAL LOW (ref 135–145)

## 2011-07-01 LAB — DIFFERENTIAL
Basophils Relative: 0 % (ref 0–1)
Eosinophils Absolute: 0.4 10*3/uL (ref 0.0–0.7)
Lymphs Abs: 1 10*3/uL (ref 0.7–4.0)
Monocytes Absolute: 0.7 10*3/uL (ref 0.1–1.0)
Monocytes Relative: 12 % (ref 3–12)

## 2011-07-01 LAB — URINE MICROSCOPIC-ADD ON

## 2011-07-01 MED ORDER — HYDROCODONE-ACETAMINOPHEN 5-325 MG PO TABS
1.0000 | ORAL_TABLET | Freq: Once | ORAL | Status: AC
  Administered 2011-07-01: 1 via ORAL
  Filled 2011-07-01: qty 1

## 2011-07-01 NOTE — ED Notes (Signed)
Pt from home with reports of increasing number of falls and abdominal pain in the umbilical area that radiates to back, started on Thursday. Pt denies N/V/D and dysuria but reports that urine has an odor.

## 2011-07-01 NOTE — ED Provider Notes (Signed)
History     CSN: [id number]  Arrival date & time 07/01/11  1854   First MD Initiated Contact with Patient 07/01/11 2039      Chief Complaint  Patient presents with  . Abdominal Pain    Umbilical area  . Fall    (Consider location/radiation/quality/duration/timing/severity/associated sxs/prior treatment) Patient is a 78 y.o. male presenting with abdominal pain and fall.  Abdominal Pain The primary symptoms of the illness include abdominal pain. The primary symptoms of the illness do not include fever, shortness of breath, nausea, vomiting or dysuria.  The abdominal pain began more than 2 days ago. Pain Location: The discomfort is across midsection abdomen and around and across back.   Associated with: He feels the discomfort when he lies down and goes to stand up. Risk factors for an acute abdominal problem include being elderly. Additional symptoms associated with the illness include constipation and back pain. Symptoms associated with the illness do not include chills. Associated medical issues comments: He reports fall x 2 in the past one week, both mechanical falls. He denies direct abdominal trauma with either fall..  Fall The fall occurred while walking. Associated symptoms include abdominal pain. Pertinent negatives include no fever, no nausea, no vomiting and no loss of consciousness. Associated symptoms comments: He walks with a walker or cane and reports his feet get tangled. He denies head injury, chest pain, breathing problems, vomiting, change in appetite, melena or urinary problems. No recent fevers or illnesses..    Past Medical History  Diagnosis Date  . DJD (degenerative joint disease)   . Anxiety   . Depression   . Insomnia   . Duodenal ulcer   . Bronchiectasis     ct chest 05/09/10  . Systolic heart failure     with ejection fracture of 30%  . Aortic regurgitation     mild to moderate  . Tricuspid regurgitation     mild to moderate    Past Surgical History   Procedure Date  . Left hydrocele   . Hernia repair   . Hemorrhoid surgery   . Total knee arthroplasty     right  . Appendectomy   . Bilateral cataract repair     Family History  Problem Relation Age of Onset  . Heart disease Mother     History  Substance Use Topics  . Smoking status: Never Smoker   . Smokeless tobacco: Never Used  . Alcohol Use: No      Review of Systems  Constitutional: Negative for fever and chills.  HENT: Negative.  Negative for neck pain.   Respiratory: Negative.  Negative for shortness of breath.   Cardiovascular: Negative.  Negative for chest pain.  Gastrointestinal: Positive for abdominal pain and constipation. Negative for nausea and vomiting.  Genitourinary: Negative for dysuria.  Musculoskeletal: Positive for back pain.  Skin: Negative.   Neurological: Negative.  Negative for dizziness, loss of consciousness, syncope, weakness and light-headedness.    Allergies  Penicillins  Home Medications   Current Outpatient Rx  Name Route Sig Dispense Refill  . AMITRIPTYLINE HCL 100 MG PO TABS Oral Take 100 mg by mouth at bedtime.    Marland Kitchen CLONAZEPAM 0.125 MG PO TBDP Oral Take 0.125 mg by mouth 2 (two) times daily as needed. For anxiety    . DONEPEZIL HCL 10 MG PO TABS Oral Take 10 mg by mouth at bedtime.    Marland Kitchen FINASTERIDE 5 MG PO TABS  Once a day     .  IBUPROFEN 200 MG PO TABS Oral Take 200 mg by mouth daily as needed. For knee pain.    Marland Kitchen MULTIVITAMINS PO CAPS  Once a day     . ICAPS PO Oral Take by mouth. Once a day     . OCUVITE PO  1 tablet twice a day       BP 123/69  Pulse 66  Temp(Src) 97.8 F (36.6 C) (Oral)  Resp 20  SpO2 100%  Physical Exam  Constitutional: He is oriented to person, place, and time. He appears well-developed and well-nourished. No distress.  HENT:  Head: Normocephalic and atraumatic.  Neck: Normal range of motion. Neck supple.       No midline neck tenderness.  Cardiovascular: Normal rate and regular rhythm.    Pulmonary/Chest: Effort normal and breath sounds normal.  Abdominal: Soft. Bowel sounds are normal. There is no tenderness. There is no rebound and no guarding.       The abdomen is soft with large palpable nontender areas of fullness in RLQ and LLQ c/w probable stool burden.   Musculoskeletal: Normal range of motion.  Neurological: He is alert and oriented to person, place, and time. Coordination normal.       The patient moves from chair to bed with full coordination. Slow movements, equal strength.  Skin: Skin is warm and dry. No rash noted.  Psychiatric: He has a normal mood and affect.    ED Course  Procedures (including critical care time)  Labs Reviewed - No data to display No results found. Results for orders placed during the hospital encounter of 07/01/11  BASIC METABOLIC PANEL      Component Value Range   Sodium 132 (*) 135 - 145 (mEq/L)   Potassium 4.5  3.5 - 5.1 (mEq/L)   Chloride 97  96 - 112 (mEq/L)   CO2 25  19 - 32 (mEq/L)   Glucose, Bld 98  70 - 99 (mg/dL)   BUN 19  6 - 23 (mg/dL)   Creatinine, Ser 7.82  0.50 - 1.35 (mg/dL)   Calcium 9.3  8.4 - 95.6 (mg/dL)   GFR calc non Af Amer 73 (*) >90 (mL/min)   GFR calc Af Amer 85 (*) >90 (mL/min)  CBC      Component Value Range   WBC 5.6  4.0 - 10.5 (K/uL)   RBC 4.19 (*) 4.22 - 5.81 (MIL/uL)   Hemoglobin 12.6 (*) 13.0 - 17.0 (g/dL)   HCT 21.3 (*) 08.6 - 52.0 (%)   MCV 88.8  78.0 - 100.0 (fL)   MCH 30.1  26.0 - 34.0 (pg)   MCHC 33.9  30.0 - 36.0 (g/dL)   RDW 57.8  46.9 - 62.9 (%)   Platelets 260  150 - 400 (K/uL)  DIFFERENTIAL      Component Value Range   Neutrophils Relative 62  43 - 77 (%)   Neutro Abs 3.5  1.7 - 7.7 (K/uL)   Lymphocytes Relative 18  12 - 46 (%)   Lymphs Abs 1.0  0.7 - 4.0 (K/uL)   Monocytes Relative 12  3 - 12 (%)   Monocytes Absolute 0.7  0.1 - 1.0 (K/uL)   Eosinophils Relative 8 (*) 0 - 5 (%)   Eosinophils Absolute 0.4  0.0 - 0.7 (K/uL)   Basophils Relative 0  0 - 1 (%)   Basophils  Absolute 0.0  0.0 - 0.1 (K/uL)  URINALYSIS, ROUTINE W REFLEX MICROSCOPIC      Component Value Range  Color, Urine YELLOW  YELLOW    APPearance CLOUDY (*) CLEAR    Specific Gravity, Urine 1.011  1.005 - 1.030    pH 6.5  5.0 - 8.0    Glucose, UA NEGATIVE  NEGATIVE (mg/dL)   Hgb urine dipstick NEGATIVE  NEGATIVE    Bilirubin Urine NEGATIVE  NEGATIVE    Ketones, ur NEGATIVE  NEGATIVE (mg/dL)   Protein, ur NEGATIVE  NEGATIVE (mg/dL)   Urobilinogen, UA 1.0  0.0 - 1.0 (mg/dL)   Nitrite NEGATIVE  NEGATIVE    Leukocytes, UA MODERATE (*) NEGATIVE   URINE MICROSCOPIC-ADD ON      Component Value Range   WBC, UA 11-20  <3 (WBC/hpf)   Bacteria, UA MANY (*) RARE    Dg Abd Acute W/chest  07/01/2011  *RADIOLOGY REPORT*  Clinical Data: Shortness of breath, abdominal pain, fall  ACUTE ABDOMEN SERIES (ABDOMEN 2 VIEW & CHEST 1 VIEW)  Comparison: 05/17/2010  Findings: Increased interstitial markings.  Right basilar scarring versus atelectasis. No pleural effusion or pneumothorax.  Stable mild cardiomegaly.  Suspected left lateral 7-9th rib fractures.  Nonspecific bowel gas pattern, without disproportionate dilatation of the small bowel to suggest small bowel obstruction.  Prominent debris/stool-filled structure in the central abdomen may reflect stool within sigmoid colon, but is incompletely characterized.  Large amount of stool throughout the colon.  Suspected moderate compression deformity at L1, new.  Mild irregularity of the right inferior pubic ramus, although this may be secondary to projection.  IMPRESSION: No evidence of acute cardiopulmonary disease.  Stable mild cardiomegaly.  Large amount of stool throughout the colon.  Prominent debris/stool- filled structure in the central abdomen may reflect stool within sigmoid colon, but is incompletely characterized.  Suspected left lateral 7-9th rib fractures.  Suspected moderate compression deformity at L1, new.  Mild irregularity of the right inferior pubic  ramus, equivocal.  Original Report Authenticated By: Charline Bills, M.D.    No diagnosis found. 1. UTI 2. Constipation 3. Fall    MDM  There is a friend at bedside who is also POA, who reports patient is falling more frequently than he admits. He has had home health come to evaluate but friend says the patient is unwilling to allow anyone in his house regularly. He refuses any possibility of nursing home placement. There is a concern that he is not taking his medications the right way, but the patient reports he does not need help with this. Will evaluate for back and abdominal complaints and anticipate discharge home. Friend is aware and agrees.  23:50:  Abdominal film shows large stool burden c/w constipation. He has a UTI but VSS, patient alert, oriented and in NAD. Feel he can be discharged home to follow up with Dr. Daphine Deutscher for recheck this week.         Rodena Medin, PA-C 07/01/11 2353

## 2011-07-01 NOTE — ED Notes (Signed)
Pt given urinal for urine sample 

## 2011-07-01 NOTE — ED Notes (Signed)
Pt c/o of lower abdominal pain that radiates throughout lower back. Pt stated "pain worsens when lying down". Pt stated he "fell last week and that is when the pain started". Pt has been taking advil to relieve the pain, but states "it is not working as well and takes too long to work". Pt denies n/v/d.

## 2011-07-01 NOTE — ED Notes (Signed)
Patient transported to X-ray 

## 2011-07-01 NOTE — ED Notes (Addendum)
Pts friend stated that the pt has fallen more times then he is telling us and doesn't think that he is taking the correct medicines or doses that he told us. Notified [place], [place]

## 2011-07-02 MED ORDER — POLYETHYLENE GLYCOL 3350 17 G PO PACK: 17.0000 g | PACK | Freq: Every day | ORAL | Status: AC

## 2011-07-02 MED ORDER — TRAMADOL HCL 50 MG PO TABS: 50.0000 mg | ORAL_TABLET | Freq: Four times a day (QID) | ORAL | Status: AC | PRN

## 2011-07-02 MED ORDER — SULFAMETHOXAZOLE-TMP DS 800-160 MG PO TABS
1.0000 | ORAL_TABLET | Freq: Once | ORAL | Status: AC
  Administered 2011-07-02: 1 via ORAL
  Filled 2011-07-02: qty 1

## 2011-07-02 MED ORDER — SULFAMETHOXAZOLE-TRIMETHOPRIM 800-160 MG PO TABS: 1.0000 | ORAL_TABLET | Freq: Two times a day (BID) | ORAL | Status: AC

## 2011-07-02 NOTE — ED Notes (Signed)
Pt needs help with getting dress and sitting up, cannot stand to pull up pants, pt needs a wheel chair and assistants for mobility.

## 2011-07-02 NOTE — ED Notes (Signed)
Patient given discharge instructions, information, prescriptions, and diet order. Patient states that they adequately understand discharge information given and to return to ED if symptoms return or worsen.     

## 2011-07-02 NOTE — Discharge Instructions (Signed)
YOU CAN BE DISCHARGED HOME AND SHOULD FOLLOW UP WITH DR. SHAW ON Monday. YOU HAVE A URINARY TRACT INFECTION AS WELL AS MULTIPLE FRACTURES IN RIBS (OLD?) AND COMPRESSION FX IN YOUR SPINE. YOU HAVE SAID YOU ARE OPPOSED TO NURSING HOME CARE BUT ARE ENCOURAGED TO CONSIDER THAT YOU CAN MAINTAIN YOUR INDEPENDENCE AT A NURSING FACILITY AND BE SAFER WITH THE INCREASED NUMBER OF FALLS YOU ARE EXPERIENCING. RETURN HERE AS NEEDED. TAKE MEDICATIONS FOR INFECTION AND FOR CONSTIPATION AS DIRECTED.  Constipation in Adults Constipation is having fewer than 2 bowel movements per week. Usually, the stools are hard. As we grow older, constipation is more common. If you try to fix constipation with laxatives, the problem may get worse. This is because laxatives taken over a long period of time make the colon muscles weaker. A low-fiber diet, not taking in enough fluids, and taking some medicines may make these problems worse. MEDICATIONS THAT MAY CAUSE CONSTIPATION  Water pills (diuretics).   Calcium channel blockers (used to control blood pressure and for the heart).   Certain pain medicines (narcotics).   Anticholinergics.   Anti-inflammatory agents.   Antacids that contain aluminum.  DISEASES THAT CONTRIBUTE TO CONSTIPATION  Diabetes.   Parkinson's disease.   Dementia.   Stroke.   Depression.   Illnesses that cause problems with salt and water metabolism.  HOME CARE INSTRUCTIONS   Constipation is usually best cared for without medicines. Increasing dietary fiber and eating more fruits and vegetables is the best way to manage constipation.   Slowly increase fiber intake to 25 to 38 grams per day. Whole grains, fruits, vegetables, and legumes are good sources of fiber. A dietitian can further help you incorporate high-fiber foods into your diet.   Drink enough water and fluids to keep your urine clear or pale yellow.   A fiber supplement may be added to your diet if you cannot get enough fiber  from foods.   Increasing your activities also helps improve regularity.   Suppositories, as suggested by your caregiver, will also help. If you are using antacids, such as aluminum or calcium containing products, it will be helpful to switch to products containing magnesium if your caregiver says it is okay.   If you have been given a liquid injection (enema) today, this is only a temporary measure. It should not be relied on for treatment of longstanding (chronic) constipation.   Stronger measures, such as magnesium sulfate, should be avoided if possible. This may cause uncontrollable diarrhea. Using magnesium sulfate may not allow you time to make it to the bathroom.  SEEK IMMEDIATE MEDICAL CARE IF:   There is bright red blood in the stool.   The constipation stays for more than 4 days.   There is belly (abdominal) or rectal pain.   You do not seem to be getting better.   You have any questions or concerns.  MAKE SURE YOU:   Understand these instructions.   Will watch your condition.   Will get help right away if you are not doing well or get worse.  Document Released: 11/26/2003 Document Revised: 02/16/2011 Document Reviewed: 01/31/2011 [facility] Patient Information 2012 [place], [place].  Urinary Tract Infection Infections of the urinary tract can start in several places. A bladder infection (cystitis), a kidney infection (pyelonephritis), and a prostate infection (prostatitis) are different types of urinary tract infections (UTIs). They usually get better if treated with medicines (antibiotics) that kill germs. Take all the medicine until it is gone. You or  your child may feel better in a few days, but TAKE ALL MEDICINE or the infection may not respond and may become more difficult to treat. HOME CARE INSTRUCTIONS   Drink enough water and fluids to keep the urine clear or pale yellow. Cranberry juice is especially recommended, in addition to large amounts of water.   Avoid  caffeine, tea, and carbonated beverages. They tend to irritate the bladder.   Alcohol may irritate the prostate.   Only take over-the-counter or prescription medicines for pain, discomfort, or fever as directed by your caregiver.  To prevent further infections:  Empty the bladder often. Avoid holding urine for long periods of time.   After a bowel movement, women should cleanse from front to back. Use each tissue only once.   Empty the bladder before and after sexual intercourse.  FINDING OUT THE RESULTS OF YOUR TEST Not all test results are available during your visit. If your or your child's test results are not back during the visit, make an appointment with your caregiver to find out the results. Do not assume everything is normal if you have not heard from your caregiver or the medical facility. It is important for you to follow up on all test results. SEEK MEDICAL CARE IF:   There is back pain.   Your baby is older than 3 months with a rectal temperature of 100.5 F (38.1 C) or higher for more than 1 day.   Your or your child's problems (symptoms) are no better in 3 days. Return sooner if you or your child is getting worse.  SEEK IMMEDIATE MEDICAL CARE IF:   There is severe back pain or lower abdominal pain.   You or your child develops chills.   You have a fever.   Your baby is older than 3 months with a rectal temperature of 102 F (38.9 C) or higher.   Your baby is 49 months old or younger with a rectal temperature of 100.4 F (38 C) or higher.   There is nausea or vomiting.   There is continued burning or discomfort with urination.  MAKE SURE YOU:   Understand these instructions.   Will watch your condition.   Will get help right away if you are not doing well or get worse.  Document Released: 12/07/2004 Document Revised: 02/16/2011 Document Reviewed: 07/12/2006 [facility] Patient Information 2012 [place], [place].

## 2011-07-05 NOTE — ED Provider Notes (Signed)
Medical screening examination/treatment/procedure(s) were performed by non-physician practitioner and as supervising physician I was immediately available for consultation/collaboration.   Laray Anger, DO 07/05/11 2026

## 2011-07-07 ENCOUNTER — Inpatient Hospital Stay (HOSPITAL_COMMUNITY): Payer: Medicare Other

## 2011-07-07 ENCOUNTER — Emergency Department (HOSPITAL_COMMUNITY): Payer: Medicare Other

## 2011-07-07 ENCOUNTER — Inpatient Hospital Stay (HOSPITAL_COMMUNITY)
Admission: EM | Admit: 2011-07-07 | Discharge: 2011-07-17 | DRG: 641 | Disposition: A | Discharge status: Nursing Home (Includes ICF) | Payer: Medicare Other | Attending: Internal Medicine | Admitting: Internal Medicine

## 2011-07-07 ENCOUNTER — Encounter (HOSPITAL_COMMUNITY): Payer: Self-pay | Admitting: Emergency Medicine

## 2011-07-07 DIAGNOSIS — I079 Rheumatic tricuspid valve disease, unspecified: Secondary | ICD-10-CM | POA: Diagnosis present

## 2011-07-07 DIAGNOSIS — Z9181 History of falling: Secondary | ICD-10-CM

## 2011-07-07 DIAGNOSIS — Z880 Allergy status to penicillin: Secondary | ICD-10-CM

## 2011-07-07 DIAGNOSIS — W19XXXA Unspecified fall, initial encounter: Secondary | ICD-10-CM

## 2011-07-07 DIAGNOSIS — E162 Hypoglycemia, unspecified: Secondary | ICD-10-CM

## 2011-07-07 DIAGNOSIS — Z66 Do not resuscitate: Secondary | ICD-10-CM | POA: Diagnosis present

## 2011-07-07 DIAGNOSIS — R627 Adult failure to thrive: Secondary | ICD-10-CM | POA: Diagnosis present

## 2011-07-07 DIAGNOSIS — T148XXA Other injury of unspecified body region, initial encounter: Secondary | ICD-10-CM

## 2011-07-07 DIAGNOSIS — R339 Retention of urine, unspecified: Secondary | ICD-10-CM

## 2011-07-07 DIAGNOSIS — K5900 Constipation, unspecified: Secondary | ICD-10-CM | POA: Diagnosis present

## 2011-07-07 DIAGNOSIS — Z515 Encounter for palliative care: Secondary | ICD-10-CM

## 2011-07-07 DIAGNOSIS — R4182 Altered mental status, unspecified: Secondary | ICD-10-CM | POA: Diagnosis present

## 2011-07-07 DIAGNOSIS — F329 Major depressive disorder, single episode, unspecified: Secondary | ICD-10-CM | POA: Diagnosis present

## 2011-07-07 DIAGNOSIS — M1990 Unspecified osteoarthritis, unspecified site: Secondary | ICD-10-CM | POA: Diagnosis present

## 2011-07-07 DIAGNOSIS — I359 Nonrheumatic aortic valve disorder, unspecified: Secondary | ICD-10-CM | POA: Diagnosis present

## 2011-07-07 DIAGNOSIS — R1110 Vomiting, unspecified: Secondary | ICD-10-CM | POA: Diagnosis present

## 2011-07-07 DIAGNOSIS — Z96659 Presence of unspecified artificial knee joint: Secondary | ICD-10-CM

## 2011-07-07 DIAGNOSIS — Z79899 Other long term (current) drug therapy: Secondary | ICD-10-CM

## 2011-07-07 DIAGNOSIS — I509 Heart failure, unspecified: Secondary | ICD-10-CM | POA: Diagnosis present

## 2011-07-07 DIAGNOSIS — F3289 Other specified depressive episodes: Secondary | ICD-10-CM | POA: Diagnosis present

## 2011-07-07 DIAGNOSIS — E860 Dehydration: Principal | ICD-10-CM | POA: Diagnosis present

## 2011-07-07 DIAGNOSIS — F411 Generalized anxiety disorder: Secondary | ICD-10-CM | POA: Diagnosis present

## 2011-07-07 DIAGNOSIS — I5022 Chronic systolic (congestive) heart failure: Secondary | ICD-10-CM | POA: Diagnosis present

## 2011-07-07 DIAGNOSIS — F0390 Unspecified dementia without behavioral disturbance: Secondary | ICD-10-CM | POA: Diagnosis present

## 2011-07-07 HISTORY — DX: Heart failure, unspecified: I50.9

## 2011-07-07 LAB — CBC
Hemoglobin: 13.8 g/dL (ref 13.0–17.0)
Hemoglobin: 13.9 g/dL (ref 13.0–17.0)
MCH: 31.3 pg (ref 26.0–34.0)
MCH: 30.8 pg (ref 26.0–34.0)
MCHC: 36.1 g/dL — ABNORMAL HIGH (ref 30.0–36.0)
Platelets: 205 10*3/uL (ref 150–400)
RBC: 4.52 MIL/uL (ref 4.22–5.81)
RDW: 13.7 % (ref 11.5–15.5)
WBC: 6.3 10*3/uL (ref 4.0–10.5)

## 2011-07-07 LAB — COMPREHENSIVE METABOLIC PANEL
CO2: 23 mEq/L (ref 19–32)
Calcium: 9.6 mg/dL (ref 8.4–10.5)
Creatinine, Ser: 0.8 mg/dL (ref 0.50–1.35)
GFR calc Af Amer: 90 mL/min — ABNORMAL LOW (ref >90)
GFR calc non Af Amer: 77 mL/min — ABNORMAL LOW (ref >90)
Glucose, Bld: 119 mg/dL — ABNORMAL HIGH (ref 70–99)
Total Protein: 7.2 g/dL (ref 6.0–8.3)

## 2011-07-07 LAB — URINALYSIS, ROUTINE W REFLEX MICROSCOPIC
Glucose, UA: NEGATIVE mg/dL
Ketones, ur: 40 mg/dL — AB
Leukocytes, UA: NEGATIVE
Protein, ur: NEGATIVE mg/dL
Urobilinogen, UA: 1 mg/dL (ref 0.0–1.0)

## 2011-07-07 LAB — POCT I-STAT TROPONIN I: Troponin i, poc: 0.02 ng/mL (ref 0.00–0.08)

## 2011-07-07 LAB — CREATININE, SERUM
Creatinine, Ser: 0.86 mg/dL (ref 0.50–1.35)
GFR calc Af Amer: 87 mL/min — ABNORMAL LOW (ref >90)
GFR calc non Af Amer: 75 mL/min — ABNORMAL LOW (ref >90)

## 2011-07-07 LAB — GLUCOSE, CAPILLARY: Glucose-Capillary: 104 mg/dL — ABNORMAL HIGH (ref 70–99)

## 2011-07-07 MED ORDER — FLEET ENEMA 7-19 GM/118ML RE ENEM
1.0000 | ENEMA | Freq: Every day | RECTAL | Status: DC | PRN
  Filled 2011-07-07 (×2): qty 1

## 2011-07-07 MED ORDER — ONDANSETRON HCL 4 MG/2ML IJ SOLN: 4.0000 mg | Freq: Three times a day (TID) | INTRAMUSCULAR | Status: DC | PRN

## 2011-07-07 MED ORDER — TRAMADOL HCL 50 MG PO TABS: 50.0000 mg | ORAL_TABLET | Freq: Four times a day (QID) | ORAL | Status: DC | PRN

## 2011-07-07 MED ORDER — CLONAZEPAM 0.5 MG PO TABS
0.2500 mg | ORAL_TABLET | Freq: Two times a day (BID) | ORAL | Status: DC
  Administered 2011-07-07: 0.25 mg via ORAL
  Filled 2011-07-07: qty 1

## 2011-07-07 MED ORDER — POLYETHYLENE GLYCOL 3350 17 G PO PACK
17.0000 g | PACK | Freq: Two times a day (BID) | ORAL | Status: DC
  Administered 2011-07-07 – 2011-07-15 (×14): 17 g via ORAL
  Filled 2011-07-07 (×22): qty 1

## 2011-07-07 MED ORDER — MULTIVITAMINS PO CAPS: 1.0000 | ORAL_CAPSULE | Freq: Every day | ORAL | Status: DC

## 2011-07-07 MED ORDER — ONDANSETRON HCL 4 MG/2ML IJ SOLN
4.0000 mg | Freq: Four times a day (QID) | INTRAMUSCULAR | Status: DC | PRN
  Administered 2011-07-09 – 2011-07-16 (×5): 4 mg via INTRAVENOUS
  Filled 2011-07-07 (×5): qty 2

## 2011-07-07 MED ORDER — SODIUM CHLORIDE 0.9 % IV SOLN
INTRAVENOUS | Status: DC
  Administered 2011-07-07: 100 mL/h via INTRAVENOUS

## 2011-07-07 MED ORDER — ONDANSETRON HCL 4 MG/2ML IJ SOLN
INTRAMUSCULAR | Status: AC
  Filled 2011-07-07: qty 2

## 2011-07-07 MED ORDER — FINASTERIDE 5 MG PO TABS
5.0000 mg | ORAL_TABLET | Freq: Every day | ORAL | Status: DC
  Administered 2011-07-07 – 2011-07-17 (×11): 5 mg via ORAL
  Filled 2011-07-07 (×11): qty 1

## 2011-07-07 MED ORDER — THIAMINE HCL 100 MG/ML IJ SOLN
100.0000 mg | Freq: Once | INTRAMUSCULAR | Status: AC
  Administered 2011-07-07: 100 mg via INTRAVENOUS
  Filled 2011-07-07: qty 2

## 2011-07-07 MED ORDER — ACETAMINOPHEN 325 MG PO TABS
650.0000 mg | ORAL_TABLET | Freq: Four times a day (QID) | ORAL | Status: DC | PRN
  Administered 2011-07-12: 650 mg via ORAL
  Filled 2011-07-07: qty 2

## 2011-07-07 MED ORDER — SODIUM CHLORIDE 0.9 % IJ SOLN
3.0000 mL | Freq: Two times a day (BID) | INTRAMUSCULAR | Status: DC
  Administered 2011-07-08 – 2011-07-17 (×15): 3 mL via INTRAVENOUS

## 2011-07-07 MED ORDER — ALBUTEROL SULFATE (5 MG/ML) 0.5% IN NEBU: 2.5000 mg | INHALATION_SOLUTION | RESPIRATORY_TRACT | Status: DC | PRN

## 2011-07-07 MED ORDER — DONEPEZIL HCL 10 MG PO TABS
10.0000 mg | ORAL_TABLET | Freq: Every day | ORAL | Status: DC
  Administered 2011-07-07 – 2011-07-15 (×9): 10 mg via ORAL
  Filled 2011-07-07 (×12): qty 1

## 2011-07-07 MED ORDER — ADULT MULTIVITAMIN W/MINERALS CH
1.0000 | ORAL_TABLET | Freq: Every day | ORAL | Status: DC
  Administered 2011-07-07 – 2011-07-17 (×10): 1 via ORAL
  Filled 2011-07-07 (×11): qty 1

## 2011-07-07 MED ORDER — ONDANSETRON HCL 4 MG PO TABS: 4.0000 mg | ORAL_TABLET | Freq: Four times a day (QID) | ORAL | Status: DC | PRN

## 2011-07-07 MED ORDER — ENOXAPARIN SODIUM 40 MG/0.4ML ~~LOC~~ SOLN
40.0000 mg | Freq: Every day | SUBCUTANEOUS | Status: DC
  Administered 2011-07-07 – 2011-07-16 (×10): 40 mg via SUBCUTANEOUS
  Filled 2011-07-07 (×12): qty 0.4

## 2011-07-07 MED ORDER — SODIUM CHLORIDE 0.9 % IV SOLN
INTRAVENOUS | Status: AC
  Administered 2011-07-07: 18:00:00 via INTRAVENOUS

## 2011-07-07 MED ORDER — ACETAMINOPHEN 650 MG RE SUPP: 650.0000 mg | Freq: Four times a day (QID) | RECTAL | Status: DC | PRN

## 2011-07-07 MED ORDER — ALUM & MAG HYDROXIDE-SIMETH 200-200-20 MG/5ML PO SUSP
30.0000 mL | Freq: Four times a day (QID) | ORAL | Status: DC | PRN
  Administered 2011-07-16: 30 mL via ORAL
  Filled 2011-07-07: qty 30

## 2011-07-07 MED ORDER — SENNA 8.6 MG PO TABS
1.0000 | ORAL_TABLET | Freq: Every day | ORAL | Status: DC
  Administered 2011-07-07 – 2011-07-15 (×7): 8.6 mg via ORAL
  Filled 2011-07-07 (×12): qty 1

## 2011-07-07 NOTE — ED Notes (Signed)
Please contact Power of Ventura Sellers [phone] or [phone] with plan of care updates

## 2011-07-07 NOTE — ED Notes (Signed)
Pt came from home soiled with feces and urine. Pt has been cleaned.

## 2011-07-07 NOTE — ED Notes (Signed)
Pt to MRI. Family waiting in room.

## 2011-07-07 NOTE — ED Provider Notes (Signed)
1:36 PM Patient with a hx sig for aortic/tricuspid regurgitation w systolic HF was placed in CDU pending an altered mental status work up. Per his POA, [name], pt has been having increased falls over the last 2 mo and was found on the floor earlier today. Patient care resumed from Dr Ethelda Chick, [facility] side.  Patient has received thiamine, zofran and fluids. Patient re-evaluated and is resting comfortable, VSS, with no new complaints or concerns at this time. Plan per previous provider is to re-asses after labs and imaging result and admit patient. On exam: hemodynamically stable, NAD, heart w/ RRR, lungs CTAB, Chest & abd non-tender, no peripheral edema or calf tenderness.  Results for orders placed during the hospital encounter of 07/07/11  OCCULT BLOOD, POC DEVICE      Component Value Range   Fecal Occult Bld NEGATIVE    CBC      Component Value Range   WBC 6.9  4.0 - 10.5 (K/uL)   RBC 4.41  4.22 - 5.81 (MIL/uL)   Hemoglobin 13.8  13.0 - 17.0 (g/dL)   HCT 16.1 (*) 09.6 - 52.0 (%)   MCV 86.6  78.0 - 100.0 (fL)   MCH 31.3  26.0 - 34.0 (pg)   MCHC 36.1 (*) 30.0 - 36.0 (g/dL)   RDW 04.5  40.9 - 81.1 (%)   Platelets 237  150 - 400 (K/uL)  URINALYSIS, ROUTINE W REFLEX MICROSCOPIC      Component Value Range   Color, Urine YELLOW  YELLOW    APPearance CLEAR  CLEAR    Specific Gravity, Urine 1.014  1.005 - 1.030    pH 7.5  5.0 - 8.0    Glucose, UA NEGATIVE  NEGATIVE (mg/dL)   Hgb urine dipstick NEGATIVE  NEGATIVE    Bilirubin Urine NEGATIVE  NEGATIVE    Ketones, ur 40 (*) NEGATIVE (mg/dL)   Protein, ur NEGATIVE  NEGATIVE (mg/dL)   Urobilinogen, UA 1.0  0.0 - 1.0 (mg/dL)   Nitrite NEGATIVE  NEGATIVE    Leukocytes, UA NEGATIVE  NEGATIVE   COMPREHENSIVE METABOLIC PANEL      Component Value Range   Sodium 138  135 - 145 (mEq/L)   Potassium 4.3  3.5 - 5.1 (mEq/L)   Chloride 101  96 - 112 (mEq/L)   CO2 23  19 - 32 (mEq/L)   Glucose, Bld 119 (*) 70 - 99 (mg/dL)   BUN 13  6 - 23 (mg/dL)   Creatinine, Ser 9.14  0.50 - 1.35 (mg/dL)   Calcium 9.6  8.4 - 78.2 (mg/dL)   Total Protein 7.2  6.0 - 8.3 (g/dL)   Albumin 3.5  3.5 - 5.2 (g/dL)   AST 26  0 - 37 (U/L)   ALT 18  0 - 53 (U/L)   Alkaline Phosphatase 143 (*) 39 - 117 (U/L)   Total Bilirubin 1.1  0.3 - 1.2 (mg/dL)   GFR calc non Af Amer 77 (*) >90 (mL/min)   GFR calc Af Amer 90 (*) >90 (mL/min)  CK      Component Value Range   Total CK 269 (*) 7 - 232 (U/L)  POCT I-STAT TROPONIN I      Component Value Range   Troponin i, poc 0.02  0.00 - 0.08 (ng/mL)   Comment 3            . CT Head Wo Contrast (Final result)   Result time:07/07/11 1318    Final result by Rad Results In Interface (07/07/11 13:18:21)  Narrative:   *RADIOLOGY REPORT*  Clinical Data: Found on floor. Generalized weakness.  CT HEAD WITHOUT CONTRAST  IMPRESSION: No skull fracture or intracranial hemorrhage. Please see above.  CT CERVICAL SPINE Findings: No cervical spine fracture. Cervical spondylotic changes with spinal stenosis and cord flattening C4-5 through C6-7. Cervical kyphosis. No abnormal prevertebral soft tissue swelling.  IMPRESSION: No cervical spine fracture. Please see above.  Original Report Authenticated By: Fuller Canada, M.D.    Disposition:   78 yo male that presented as a level V caveat d/t new onset of altered mental status and increase falls over the last couple of month. Pt currently lives at home alone and this has been deemed unsafe. His POA is currently researching assisted living facilities for placement and is willing to work with social worker to help expedite the process.Labs and imaging reviewed. Cervical spine cleared, no evidence of stroke, UTI, or hypoglycemia. Trop neg urine culture pending. Admit to [facility] for alt mental status, urinary retention (>1L removed when cathed in ED) & pending placement.   PCP Dr. Pincus Badder     Jaci Carrel, PA-C 07/07/11 1410

## 2011-07-07 NOTE — ED Notes (Signed)
Per EMS: pt from home c/o being found in floor; pt unsure of how he got there; pt c/o generalized body aches; pt alert at present; pt seen at [facility] a couple of days ago for constipation and UTI; pt with bowel and urine incontinence not per norm; pt had some vomiting and required suctioning in route; pt c/o lower abd pain and is distended and painful to palpation; pt given 4mg  zofran in route

## 2011-07-07 NOTE — ED Notes (Signed)
Foley inserted with no resistance.  Clear yellow urine returned.

## 2011-07-07 NOTE — H&P (Signed)
PATIENT DETAILS Name: Russell Fisher Age: 78 y.o. Sex: male Date of Birth: 12-16-1920 Admit Date: 07/07/2011 ZOX:WRUE,[id number], MD, MD   CHIEF COMPLAINT:  Fall and Altered Mental Status  HPI: Patient is a 78 year old male with a past medical history of Dementia, DJD, Chronic Systolic Heart Failure, Depression/Insomnia-who has had recent admissions for falls-has refused SNF placement in the past, was brought to the hospital for the above noted complaints. Patient is a poor historian, however although slightly confused during my evaluation, he is following all commands, and is answering almost all my questions appropriately-later collaborated by Russell Fisher-patient's POA. Patient claims that he has had intermittent vomiting and abdominal pain for the past few weeks, he cannot elaborate further. He claims that he has "fell", not sure whether he actually passed out "quite a few times" over the past few days as well.He denies any cough or fever,denies any headache or neck pain. He denies diarrhea. Currently denies abd pain-claims that "belly is sore". In any event, his POA-Russell Fisher has been trying to place the patient to a ALF, he found him this am on the floor and confused and was brought to the hospital for further evaluation and treatment.   ALLERGIES:   Allergies  Allergen Reactions  . Penicillins     REACTION: swelling    PAST MEDICAL HISTORY: Past Medical History  Diagnosis Date  . DJD (degenerative joint disease)   . Anxiety   . Depression   . Insomnia   . Duodenal ulcer   . Bronchiectasis     ct chest 05/09/10  . Systolic heart failure     with ejection fracture of 30%  . Aortic regurgitation     mild to moderate  . Tricuspid regurgitation     mild to moderate    PAST SURGICAL HISTORY: Past Surgical History  Procedure Date  . Left hydrocele   . Hernia repair   . Hemorrhoid surgery   . Total knee arthroplasty     right  . Appendectomy   . Bilateral cataract repair       MEDICATIONS AT HOME: Prior to Admission medications   Medication Sig Start Date End Date Taking? Authorizing Provider  ALPRAZolam Prudy Feeler) 0.5 MG tablet Take 0.5 mg by mouth 3 (three) times daily as needed. For anxiety   Yes Historical Provider, MD  amitriptyline (ELAVIL) 100 MG tablet Take 100 mg by mouth at bedtime.   Yes Historical Provider, MD  clonazepam (KLONOPIN) 0.125 MG disintegrating tablet Take 0.125 mg by mouth 2 (two) times daily as needed. For anxiety   Yes Historical Provider, MD  donepezil (ARICEPT) 10 MG tablet Take 10 mg by mouth at bedtime.   Yes Historical Provider, MD  finasteride (PROSCAR) 5 MG tablet Once a day    Yes Historical Provider, MD  ibuprofen (ADVIL,MOTRIN) 200 MG tablet Take 200 mg by mouth daily as needed. For knee pain.   Yes Historical Provider, MD  Multiple Vitamin (MULTIVITAMIN) capsule Once a day    Yes Historical Provider, MD  Multiple Vitamins-Minerals (ICAPS PO) Take 1 tablet by mouth daily. Once a day   Yes Historical Provider, MD  Multiple Vitamins-Minerals (OCUVITE PO) 1 tablet twice a day    Yes Historical Provider, MD  sulfamethoxazole-trimethoprim (SEPTRA DS) 800-160 MG per tablet Take 1 tablet by mouth every 12 (twelve) hours. 07/02/11 07/12/11 Yes Shari A Narvaez, PA-C  traMADol (ULTRAM) 50 MG tablet Take 1 tablet (50 mg total) by mouth every 6 (six) hours as  needed for pain. 07/02/11 07/12/11 Yes Rodena Medin, PA-C    FAMILY HISTORY: Family History  Problem Relation Age of Onset  . Heart disease Mother     SOCIAL HISTORY:  reports that he has never smoked. He has never used smokeless tobacco. He reports that he does not drink alcohol or use illicit drugs.  REVIEW OF SYSTEMS:  Constitutional:   No  weight loss, night sweats,  Fevers, chills, fatigue.  HEENT:    No headaches, Difficulty swallowing,Tooth/dental problems,Sore throat,  No sneezing, itching, ear ache, nasal congestion, post nasal drip,   Cardio-vascular: No chest pain,   Orthopnea, PND, swelling in lower extremities, anasarca, dizziness, palpitations  GI:  No heartburn, indigestion, abdominal pain, nausea, vomiting, diarrhea, change in  bowel habits, loss of appetite  Resp: No shortness of breath with exertion or at rest.  No excess mucus, no productive cough, No non-productive cough,  No coughing up of blood.No change in color of mucus.No wheezing.No chest wall deformity  Skin:  no rash or lesions.  GU:  no dysuria, change in color of urine, no urgency or frequency.  No flank pain.  Musculoskeletal: No joint pain or swelling.  No decreased range of motion.  No back pain.  Psych: No change in mood or affect. No depression or anxiety.   PHYSICAL EXAM: Blood pressure 139/73, pulse 85, temperature 98.1 F (36.7 C), temperature source Oral, resp. rate 17, height 5' 8.5" (1.74 m), weight 73.483 kg (162 lb), SpO2 100.00%.  General appearance :Awake, alert, not in any distress. Speech Clear. Not toxic Looking HEENT: Atraumatic and Normocephalic, pupils equally reactive to light and accomodation Neck: supple, no JVD. No cervical lymphadenopathy.  Chest:Good air entry bilaterally, no added sounds  CVS: S1 S2 regular, no murmurs.  Abdomen: Bowel sounds present, Non tender and not distended with no gaurding, rigidity or rebound. Extremities: B/L Lower Ext shows no edema, both legs are warm to touch, with  dorsalis pedis pulses palpable. Neurology: Awake alert, and oriented X 3, CN II-XII intact, Non focal Skin:No Rash Wounds:N/A  LABS ON ADMISSION:   Basename 07/07/11 1140  NA 138  K 4.3  CL 101  CO2 23  GLUCOSE 119*  BUN 13  CREATININE 0.80  CALCIUM 9.6  MG --  PHOS --    Basename 07/07/11 1140  AST 26  ALT 18  ALKPHOS 143*  BILITOT 1.1  PROT 7.2  ALBUMIN 3.5   No results found for this basename: LIPASE:2,AMYLASE:2 in the last 72 hours  Basename 07/07/11 1140  WBC 6.9  NEUTROABS --  HGB 13.8  HCT 38.2*  MCV 86.6  PLT 237     Basename 07/07/11 1141  CKTOTAL 269*  CKMB --  CKMBINDEX --  TROPONINI --   No results found for this basename: DDIMER:2 in the last 72 hours No components found with this basename: POCBNP:3   RADIOLOGIC STUDIES ON ADMISSION: Ct Head Wo Contrast  07/07/2011  *RADIOLOGY REPORT*  Clinical Data:  Found on floor.  Generalized weakness.  CT HEAD WITHOUT CONTRAST CT CERVICAL SPINE WITHOUT CONTRAST  Technique:  Multidetector CT imaging of the head and cervical spine was performed following the standard protocol without intravenous contrast.  Multiplanar CT image reconstructions of the cervical spine were also generated.  Comparison:  05/15/2011.  CT HEAD  Findings: No skull fracture or intracranial hemorrhage.  Partial opacification right mastoid air cells unchanged.  Global atrophy without hydrocephalus.  Small vessel disease type changes without CT evidence of large  acute infarct.  Vascular calcifications.  No intracranial mass lesion detected on this unenhanced exam.  IMPRESSION: No skull fracture or intracranial hemorrhage.  Please see above.  CT CERVICAL SPINE  Findings: No cervical spine fracture.  Cervical spondylotic changes with spinal stenosis and cord flattening C4-5 through C6-7.  Cervical kyphosis.  No abnormal prevertebral soft tissue swelling.  IMPRESSION: No cervical spine fracture.  Please see above.  Original Report Authenticated By: Fuller Canada, M.D.   Ct Cervical Spine Wo Contrast  07/07/2011  *RADIOLOGY REPORT*  Clinical Data:  Found on floor.  Generalized weakness.  CT HEAD WITHOUT CONTRAST CT CERVICAL SPINE WITHOUT CONTRAST  Technique:  Multidetector CT imaging of the head and cervical spine was performed following the standard protocol without intravenous contrast.  Multiplanar CT image reconstructions of the cervical spine were also generated.  Comparison:  05/15/2011.  CT HEAD  Findings: No skull fracture or intracranial hemorrhage.  Partial opacification right mastoid air  cells unchanged.  Global atrophy without hydrocephalus.  Small vessel disease type changes without CT evidence of large acute infarct.  Vascular calcifications.  No intracranial mass lesion detected on this unenhanced exam.  IMPRESSION: No skull fracture or intracranial hemorrhage.  Please see above.  CT CERVICAL SPINE  Findings: No cervical spine fracture.  Cervical spondylotic changes with spinal stenosis and cord flattening C4-5 through C6-7.  Cervical kyphosis.  No abnormal prevertebral soft tissue swelling.  IMPRESSION: No cervical spine fracture.  Please see above.  Original Report Authenticated By: Fuller Canada, M.D.   Russell Brain Wo Contrast  07/07/2011  *RADIOLOGY REPORT*  Clinical Data: Increasing Falls.  Altered mental status.  MRI HEAD WITHOUT CONTRAST  Technique:  Multiplanar, multiecho pulse sequences of the brain and surrounding structures were obtained according to standard protocol without intravenous contrast.  Comparison: CT head 07/07/2011  Findings: Moderate atrophy.  Mild to moderate chronic microvascular ischemia in the white matter.  Brainstem is intact.  Negative for acute infarct.  Negative for hemorrhage or mass.  No subdural hemorrhage or fluid collection is identified.  Right mastoid sinus effusion.  Left mastoid sinuses clear. Paranasal sinuses are clear.  IMPRESSION: Atrophy and chronic microvascular ischemia.  No acute intracranial abnormality.  Original Report Authenticated By: Camelia Phenes, M.D.   Dg Abd Acute W/chest  07/07/2011  *RADIOLOGY REPORT*  Clinical Data: Abdominal pain with vomiting.  ACUTE ABDOMEN SERIES (ABDOMEN 2 VIEW & CHEST 1 VIEW)  Comparison: 07/01/2011  Findings: Hyperexpansion is consistent with emphysema. Interstitial markings are diffusely coarsened with chronic features. The cardiopericardial silhouette is enlarged.  Prominence of the transverse aorta raises the question of aneurysm.  Right side up decubitus film shows no evidence for intraperitoneal  free air.  Supine film shows no gaseous small bowel dilatation to suggest obstruction.  The patient does have a large volume of stool scattered along the entire length of the colon, as previously. Diffuse degenerative changes are noted the lumbar spine.  IMPRESSION: Emphysema without acute cardiopulmonary findings.  No intraperitoneal free air.  Large stool volume throughout the colon.  Imaging features are compatible with clinical constipation.  Original Report Authenticated By: ERIC A. MANSELL, M.D.   Dg Abd Acute W/chest  07/01/2011  *RADIOLOGY REPORT*  Clinical Data: Shortness of breath, abdominal pain, fall  ACUTE ABDOMEN SERIES (ABDOMEN 2 VIEW & CHEST 1 VIEW)  Comparison: 05/17/2010  Findings: Increased interstitial markings.  Right basilar scarring versus atelectasis. No pleural effusion or pneumothorax.  Stable mild cardiomegaly.  Suspected left lateral  7-9th rib fractures.  Nonspecific bowel gas pattern, without disproportionate dilatation of the small bowel to suggest small bowel obstruction.  Prominent debris/stool-filled structure in the central abdomen may reflect stool within sigmoid colon, but is incompletely characterized.  Large amount of stool throughout the colon.  Suspected moderate compression deformity at L1, new.  Mild irregularity of the right inferior pubic ramus, although this may be secondary to projection.  IMPRESSION: No evidence of acute cardiopulmonary disease.  Stable mild cardiomegaly.  Large amount of stool throughout the colon.  Prominent debris/stool- filled structure in the central abdomen may reflect stool within sigmoid colon, but is incompletely characterized.  Suspected left lateral 7-9th rib fractures.  Suspected moderate compression deformity at L1, new.  Mild irregularity of the right inferior pubic ramus, equivocal.  Original Report Authenticated By: Charline Bills, M.D.    ASSESSMENT AND PLAN: Present on Admission:  .Altered mental status -apparently this is  already much better, I have talked with Russell Fisher who concurs as well. -Patient is afebrile, neck is supple, has no other foci of infection to explain this AMS -apparently patient is on Benzo's, Narcotics etc-and probably polypharmacy accounts for this change in mental status, that is now improving. He likley has underlying dementia as well -will need PT/OT and possible SNF placement  .Vomiting -apparently this was going on for the past few days -currently not vomiting -will get a abd xray and start on clears and advance as tolerated  .CHF (congestive heart failure) -chronic systolic heart failure per prior history -currently clinically compensated  .DJD (degenerative joint disease) -stable  Constipation -?related to narcotics -start Miralax and senokot -prn Enema  Further plan will depend as patient's clinical course evolves and further radiologic and laboratory data become available. Patient will be monitored closely.I have updated Russell Fisher over the phone  DVT Prophylaxis: Prophylactic Lovenox  Code Status: Full Code  Total time spent for admission equals 45 minutes.  Jeoffrey Massed 07/07/2011, 5:49 PM

## 2011-07-07 NOTE — ED Provider Notes (Signed)
Medical screening examination/treatment/procedure(s) were conducted as a shared visit with non-physician practitioner(s) and myself.  I personally evaluated the patient during the encounter  Doug Sou, MD 07/07/11 [phone]

## 2011-07-07 NOTE — ED Notes (Signed)
Patient transported to CT 

## 2011-07-07 NOTE — ED Provider Notes (Signed)
History     CSN: [id number]  Arrival date & time 07/07/11  1043   First MD Initiated Contact with Patient 07/07/11 1050      Chief Complaint  Patient presents with  . Fall  . Altered Mental Status    (Consider location/radiation/quality/duration/timing/severity/associated sxs/prior treatment) HPI Level V caveat altered mental status. Patient found on floor today by his power of attorney Mr. Reggy Eye. Unknown time. The patient was lying on floor of his residence. Patient has been unsteady with increasing falls over the past 2 months and has been more confused over the past 2 days treated with Zofran and route by EMS as patient was actively vomiting patient also immobilized on long board with a hard collar and CID by EMS patient presently offers no complaint Past Medical History  Diagnosis Date  . DJD (degenerative joint disease)   . Anxiety   . Depression   . Insomnia   . Duodenal ulcer   . Bronchiectasis     ct chest 05/09/10  . Systolic heart failure     with ejection fracture of 30%  . Aortic regurgitation     mild to moderate  . Tricuspid regurgitation     mild to moderate    Past Surgical History  Procedure Date  . Left hydrocele   . Hernia repair   . Hemorrhoid surgery   . Total knee arthroplasty     right  . Appendectomy   . Bilateral cataract repair     Family History  Problem Relation Age of Onset  . Heart disease Mother     History  Substance Use Topics  . Smoking status: Never Smoker   . Smokeless tobacco: Never Used  . Alcohol Use: No      Review of Systems  Unable to perform ROS: Other    Allergies  Penicillins  Home Medications   Current Outpatient Rx  Name Route Sig Dispense Refill  . AMITRIPTYLINE HCL 100 MG PO TABS Oral Take 100 mg by mouth at bedtime.    Marland Kitchen CLONAZEPAM 0.125 MG PO TBDP Oral Take 0.125 mg by mouth 2 (two) times daily as needed. For anxiety    . DONEPEZIL HCL 10 MG PO TABS Oral Take 10 mg by mouth at bedtime.    Marland Kitchen  FINASTERIDE 5 MG PO TABS  Once a day     . IBUPROFEN 200 MG PO TABS Oral Take 200 mg by mouth daily as needed. For knee pain.    Marland Kitchen MULTIVITAMINS PO CAPS  Once a day     . ICAPS PO Oral Take by mouth. Once a day     . OCUVITE PO  1 tablet twice a day     . SULFAMETHOXAZOLE-TRIMETHOPRIM 800-160 MG PO TABS Oral Take 1 tablet by mouth every 12 (twelve) hours. 10 tablet 0  . TRAMADOL HCL 50 MG PO TABS Oral Take 1 tablet (50 mg total) by mouth every 6 (six) hours as needed for pain. 15 tablet 0    BP 158/87  Pulse 87  Temp(Src) 98.3 F (36.8 C) (Oral)  Resp 18  Ht 5' 8.5" (1.74 m)  Wt 162 lb (73.483 kg)  BMI 24.27 kg/m2  SpO2 100%  Physical Exam  Nursing note and vitals reviewed. Constitutional:       Chronically ill-appearing  HENT:  Head: Normocephalic and atraumatic.  Right Ear: External ear normal.  Left Ear: External ear normal.  Eyes: Conjunctivae are normal. Pupils are equal, round, and reactive to light.  Neck: Neck supple. No tracheal deviation present. No thyromegaly present.  Cardiovascular: Normal rate and regular rhythm.   No murmur heard. Pulmonary/Chest: Effort normal and breath sounds normal.  Abdominal: Soft. Bowel sounds are normal. He exhibits distension. He exhibits no mass. There is no tenderness.  Genitourinary:       Hard brown stool in rectal vault incontinent of stool fecal impaction present  Musculoskeletal: Normal range of motion. He exhibits no edema and no tenderness.       Entire spine nontender pelvis stable nontender  Neurological: He is alert. Coordination normal.       Disoriented to year and month follow simple commands moves all extremities  Skin: Skin is warm and dry. No rash noted.  Psychiatric: He has a normal mood and affect.       Pleasant cooperative    ED Course  Procedures (including critical care time) Foley catheter inserted immediately drained greater than 1 L of urine  Labs Reviewed  OCCULT BLOOD, POC DEVICE   No results  found.   Date: 07/07/2011  Rate: 80  Rhythm: normal sinus rhythm  QRS Axis: normal  Intervals: normal  ST/T Wave abnormalities: normal  Conduction Disutrbances:none  Narrative Interpretation:   Old EKG Reviewed: unchanged No significant change from 05/15/2011  No diagnosis found.  Results for orders placed during the hospital encounter of 07/07/11  OCCULT BLOOD, POC DEVICE      Component Value Range   Fecal Occult Bld NEGATIVE    CBC      Component Value Range   WBC 6.9  4.0 - 10.5 (K/uL)   RBC 4.41  4.22 - 5.81 (MIL/uL)   Hemoglobin 13.8  13.0 - 17.0 (g/dL)   HCT 16.1 (*) 09.6 - 52.0 (%)   MCV 86.6  78.0 - 100.0 (fL)   MCH 31.3  26.0 - 34.0 (pg)   MCHC 36.1 (*) 30.0 - 36.0 (g/dL)   RDW 04.5  40.9 - 81.1 (%)   Platelets 237  150 - 400 (K/uL)  URINALYSIS, ROUTINE W REFLEX MICROSCOPIC      Component Value Range   Color, Urine YELLOW  YELLOW    APPearance CLEAR  CLEAR    Specific Gravity, Urine 1.014  1.005 - 1.030    pH 7.5  5.0 - 8.0    Glucose, UA NEGATIVE  NEGATIVE (mg/dL)   Hgb urine dipstick NEGATIVE  NEGATIVE    Bilirubin Urine NEGATIVE  NEGATIVE    Ketones, ur 40 (*) NEGATIVE (mg/dL)   Protein, ur NEGATIVE  NEGATIVE (mg/dL)   Urobilinogen, UA 1.0  0.0 - 1.0 (mg/dL)   Nitrite NEGATIVE  NEGATIVE    Leukocytes, UA NEGATIVE  NEGATIVE    Dg Abd Acute W/chest  07/01/2011  *RADIOLOGY REPORT*  Clinical Data: Shortness of breath, abdominal pain, fall  ACUTE ABDOMEN SERIES (ABDOMEN 2 VIEW & CHEST 1 VIEW)  Comparison: 05/17/2010  Findings: Increased interstitial markings.  Right basilar scarring versus atelectasis. No pleural effusion or pneumothorax.  Stable mild cardiomegaly.  Suspected left lateral 7-9th rib fractures.  Nonspecific bowel gas pattern, without disproportionate dilatation of the small bowel to suggest small bowel obstruction.  Prominent debris/stool-filled structure in the central abdomen may reflect stool within sigmoid colon, but is incompletely  characterized.  Large amount of stool throughout the colon.  Suspected moderate compression deformity at L1, new.  Mild irregularity of the right inferior pubic ramus, although this may be secondary to projection.  IMPRESSION: No evidence of acute cardiopulmonary disease.  Stable mild cardiomegaly.  Large amount of stool throughout the colon.  Prominent debris/stool- filled structure in the central abdomen may reflect stool within sigmoid colon, but is incompletely characterized.  Suspected left lateral 7-9th rib fractures.  Suspected moderate compression deformity at L1, new.  Mild irregularity of the right inferior pubic ramus, equivocal.  Original Report Authenticated By: Charline Bills, M.D.     MDM  Patient with acute delirium and multiple falls and urinary retention Or are inpatient stay or to hospital observation. He is unsafe to be at home alone.        Doug Sou, MD 07/07/11 1249

## 2011-07-08 ENCOUNTER — Encounter (HOSPITAL_COMMUNITY): Payer: Self-pay | Admitting: *Deleted

## 2011-07-08 LAB — BASIC METABOLIC PANEL
BUN: 15 mg/dL (ref 6–23)
Calcium: 9.6 mg/dL (ref 8.4–10.5)
Creatinine, Ser: 0.82 mg/dL (ref 0.50–1.35)
GFR calc Af Amer: 89 mL/min — ABNORMAL LOW (ref >90)
GFR calc non Af Amer: 77 mL/min — ABNORMAL LOW (ref >90)
Glucose, Bld: 103 mg/dL — ABNORMAL HIGH (ref 70–99)
Potassium: 4.4 mEq/L (ref 3.5–5.1)

## 2011-07-08 LAB — GLUCOSE, CAPILLARY: Glucose-Capillary: 100 mg/dL — ABNORMAL HIGH (ref 70–99)

## 2011-07-08 MED ORDER — SODIUM CHLORIDE 0.9 % IV SOLN
INTRAVENOUS | Status: DC
  Administered 2011-07-08 – 2011-07-11 (×3): via INTRAVENOUS

## 2011-07-08 NOTE — Evaluation (Signed)
Physical Therapy Evaluation Patient Details Name: Russell Fisher MRN: [id number] DOB: [birth year] Today's Date: 07/08/2011 Time: 3244-0102 PT Time Calculation (min): 27 min  PT Assessment / Plan / Recommendation Clinical Impression  Pt is 78 y/o male admitted for fall, AMS and positive orthostatics.  Pt BP was the following:  supine- 109/63, sitting - 107/67, standing -93/63, after transfer - 123/69.  Attempted to stand x 2 and pt return to sitting immediately after standing due to fatigue and decrease in BP.  Will continue to follow for acute PT to continue to assess and increase overall mobility.    PT Assessment  Patient needs continued PT services    Follow Up Recommendations  Home health PT;Supervision/Assistance - 24 hour    Equipment Recommendations  None recommended by PT    Frequency Min 3X/week    Precautions / Restrictions Precautions Precautions: Fall   Pertinent Vitals/Pain No c/o pain however PTA having pressure in bladder area      Mobility  Bed Mobility Bed Mobility: Supine to Sit Supine to Sit: 4: Min assist;HOB flat;With rails Details for Bed Mobility Assistance: (A) to elevate trunk OOB with cues for technique Transfers Transfers: Sit to Stand;Stand to Sit Sit to Stand: 4: Min assist;From bed;From elevated surface Stand to Sit: 4: Min assist;To bed;To chair/3-in-1;To elevated surface Details for Transfer Assistance: (A) to initiate transfer and to slowly descend to recliner with max cues for hand placement Ambulation/Gait Ambulation/Gait Assistance: 4: Min assist Ambulation Distance (Feet): 5 Feet Assistive device: Rolling walker Ambulation/Gait Assistance Details: Pt able to take 5 side steps to recliner however limited due to orthostatic and c/o dizziness. Gait Pattern: Step-to pattern    Exercises     PT Goals Acute Rehab PT Goals PT Goal Formulation: With patient Time For Goal Achievement: 07/15/11 Potential to Achieve Goals: Good Pt will  go Supine/Side to Sit: with modified independence PT Goal: Supine/Side to Sit - Progress: Goal set today Pt will Sit at Edge of Bed: with modified independence;1-2 min PT Goal: Sit at Edge Of Bed - Progress: Goal set today Pt will go Sit to Supine/Side: with modified independence PT Goal: Sit to Supine/Side - Progress: Goal set today Pt will go Sit to Stand: with modified independence;from elevated surface PT Goal: Sit to Stand - Progress: Goal set today Pt will go Stand to Sit: with modified independence PT Goal: Stand to Sit - Progress: Goal set today Pt will Transfer Bed to Chair/Chair to Bed: with modified independence PT Transfer Goal: Bed to Chair/Chair to Bed - Progress: Goal set today Pt will Ambulate: >150 feet;with modified independence;with rolling walker PT Goal: Ambulate - Progress: Goal set today Pt will Go Up / Down Stairs: 1-2 stairs;with supervision;with rail(s) PT Goal: Up/Down Stairs - Progress: Goal set today  Visit Information  Last PT Received On: 07/08/11 Assistance Needed: +1    Subjective Data  Subjective: "I'm weak from laying in this bed and I fell because I go up to fast." Patient Stated Goal: Did not set   Prior Functioning  Home Living Lives With: Alone Type of Home: House Home Access: Stairs to enter [employer] of Steps: 1 Entrance Stairs-Rails: Right Home Layout: One level Bathroom Shower/Tub: Tub/shower unit;Door [employer] Toilet: Standard Bathroom Accessibility: Yes How Accessible: Accessible via walker Home Adaptive Equipment: Grab bars in shower;Walker - rolling;Straight cane;Bedside commode/3-in-1 Prior Function Level of Independence: Independent with assistive device(s) Able to Take Stairs?: Yes Driving: No Comments: Pt has friend that comes every other day  for a few hours to assist with cooking and cleaning and stay while he gets in the tub.   Communication Communication: HOH    Cognition  Orientation Level: Disoriented  to;Time    Extremity/Trunk Assessment Right Lower Extremity Assessment RLE ROM/Strength/Tone: Within functional levels Left Lower Extremity Assessment LLE ROM/Strength/Tone: Within functional levels   Balance Balance Balance Assessed: Yes Static Sitting Balance Static Sitting - Balance Support: Feet supported;No upper extremity supported Static Sitting - Level of Assistance: 5: Stand by assistance Static Sitting - Comment/# of Minutes: ~ 5 minutes while taking BP and stand by assist for safety  End of Session PT - End of Session Equipment Utilized During Treatment: Gait belt Activity Tolerance: Treatment limited secondary to medical complications (Comment) (orthostatic BP) Patient left: in chair;with call bell/phone within reach;with nursing in room Nurse Communication: Other (comment) (BP)   Kay Ricciuti 07/08/2011, 12:07 PM  Jake Shark, PT DPT [phone]

## 2011-07-08 NOTE — Progress Notes (Signed)
Subjective: No new issues overnight, tolerating clears.  Objective: Vital signs in last 24 hours: Temp:  [97.5 F (36.4 C)-98.4 F (36.9 C)] 98.4 F (36.9 C) (04/27 0600) Pulse Rate:  [76-133] 86  (04/27 0604) Resp:  [15-20] 18  (04/27 0600) BP: (98-158)/(43-110) 130/110 mmHg (04/27 0604) SpO2:  [79 %-100 %] 95 % (04/27 0600) Weight:  [73.483 kg (162 lb)] 73.483 kg (162 lb) (04/26 1107) Weight change:  Last BM Date: 07/07/11  Intake/Output from previous day: 04/26 0701 - 04/27 0700 In: -  Out: 1600 [Urine:1600]     Physical Exam: General: Comfortable, alert, communicative, oriented to person, place and month, but not to day or year, not short of breath at rest.  HEENT:  No clinical pallor, no jaundice, no conjunctival injection or discharge. Buccal mucosa looks "dry". NECK:  Supple, JVP not seen, no carotid bruits, no palpable lymphadenopathy, no palpable goiter. CHEST:  Clinically clear to auscultation, no wheezes, no crackles. HEART:  Sounds 1 and 2 heard, normal, regular, no murmurs. ABDOMEN:  Full, soft, non-tender, no palpable organomegaly, no palpable masses, normal bowel sounds. GENITALIA:  Not examined. LOWER EXTREMITIES:  No pitting edema, palpable peripheral pulses. MUSCULOSKELETAL SYSTEM:  Generalized osteoarthritic changes, otherwise, normal. Has abrasion left elbow. CENTRAL NERVOUS SYSTEM:  No focal neurologic deficit on gross examination.  Lab Results:  Basename 07/07/11 1746 07/07/11 1140  WBC 6.3 6.9  HGB 13.9 13.8  HCT 39.2 38.2*  PLT 205 237    Basename 07/07/11 1746 07/07/11 1140  NA -- 138  K -- 4.3  CL -- 101  CO2 -- 23  GLUCOSE -- 119*  BUN -- 13  CREATININE 0.86 0.80  CALCIUM -- 9.6   No results found for this or any previous visit (from the past 240 hour(s)).   Studies/Results: Ct Head Wo Contrast  07/07/2011  *RADIOLOGY REPORT*  Clinical Data:  Found on floor.  Generalized weakness.  CT HEAD WITHOUT CONTRAST CT CERVICAL SPINE WITHOUT  CONTRAST  Technique:  Multidetector CT imaging of the head and cervical spine was performed following the standard protocol without intravenous contrast.  Multiplanar CT image reconstructions of the cervical spine were also generated.  Comparison:  05/15/2011.  CT HEAD  Findings: No skull fracture or intracranial hemorrhage.  Partial opacification right mastoid air cells unchanged.  Global atrophy without hydrocephalus.  Small vessel disease type changes without CT evidence of large acute infarct.  Vascular calcifications.  No intracranial mass lesion detected on this unenhanced exam.  IMPRESSION: No skull fracture or intracranial hemorrhage.  Please see above.  CT CERVICAL SPINE  Findings: No cervical spine fracture.  Cervical spondylotic changes with spinal stenosis and cord flattening C4-5 through C6-7.  Cervical kyphosis.  No abnormal prevertebral soft tissue swelling.  IMPRESSION: No cervical spine fracture.  Please see above.  Original Report Authenticated By: Fuller Canada, M.D.   Ct Cervical Spine Wo Contrast  07/07/2011  *RADIOLOGY REPORT*  Clinical Data:  Found on floor.  Generalized weakness.  CT HEAD WITHOUT CONTRAST CT CERVICAL SPINE WITHOUT CONTRAST  Technique:  Multidetector CT imaging of the head and cervical spine was performed following the standard protocol without intravenous contrast.  Multiplanar CT image reconstructions of the cervical spine were also generated.  Comparison:  05/15/2011.  CT HEAD  Findings: No skull fracture or intracranial hemorrhage.  Partial opacification right mastoid air cells unchanged.  Global atrophy without hydrocephalus.  Small vessel disease type changes without CT evidence of large acute infarct.  Vascular calcifications.  No intracranial mass lesion detected on this unenhanced exam.  IMPRESSION: No skull fracture or intracranial hemorrhage.  Please see above.  CT CERVICAL SPINE  Findings: No cervical spine fracture.  Cervical spondylotic changes with spinal  stenosis and cord flattening C4-5 through C6-7.  Cervical kyphosis.  No abnormal prevertebral soft tissue swelling.  IMPRESSION: No cervical spine fracture.  Please see above.  Original Report Authenticated By: Fuller Canada, M.D.   Mr Brain Wo Contrast  07/07/2011  *RADIOLOGY REPORT*  Clinical Data: Increasing Falls.  Altered mental status.  MRI HEAD WITHOUT CONTRAST  Technique:  Multiplanar, multiecho pulse sequences of the brain and surrounding structures were obtained according to standard protocol without intravenous contrast.  Comparison: CT head 07/07/2011  Findings: Moderate atrophy.  Mild to moderate chronic microvascular ischemia in the white matter.  Brainstem is intact.  Negative for acute infarct.  Negative for hemorrhage or mass.  No subdural hemorrhage or fluid collection is identified.  Right mastoid sinus effusion.  Left mastoid sinuses clear. Paranasal sinuses are clear.  IMPRESSION: Atrophy and chronic microvascular ischemia.  No acute intracranial abnormality.  Original Report Authenticated By: Camelia Phenes, M.D.   Dg Abd Acute W/chest  07/07/2011  *RADIOLOGY REPORT*  Clinical Data: Abdominal pain with vomiting.  ACUTE ABDOMEN SERIES (ABDOMEN 2 VIEW & CHEST 1 VIEW)  Comparison: 07/01/2011  Findings: Hyperexpansion is consistent with emphysema. Interstitial markings are diffusely coarsened with chronic features. The cardiopericardial silhouette is enlarged.  Prominence of the transverse aorta raises the question of aneurysm.  Right side up decubitus film shows no evidence for intraperitoneal free air.  Supine film shows no gaseous small bowel dilatation to suggest obstruction.  The patient does have a large volume of stool scattered along the entire length of the colon, as previously. Diffuse degenerative changes are noted the lumbar spine.  IMPRESSION: Emphysema without acute cardiopulmonary findings.  No intraperitoneal free air.  Large stool volume throughout the colon.  Imaging  features are compatible with clinical constipation.  Original Report Authenticated By: ERIC A. MANSELL, M.D.    Medications: Scheduled Meds:   . clonazePAM  0.25 mg Oral BID  . donepezil  10 mg Oral QHS  . enoxaparin  40 mg Subcutaneous QHS  . finasteride  5 mg Oral Daily  . mulitivitamin with minerals  1 tablet Oral Daily  . polyethylene glycol  17 g Oral BID  . senna  1 tablet Oral QHS  . sodium chloride  3 mL Intravenous Q12H  . thiamine  100 mg Intravenous Once  . DISCONTD: multivitamin  1 capsule Oral Daily   Continuous Infusions:   . sodium chloride 40 mL/hr at 07/07/11 1815  . DISCONTD: sodium chloride 100 mL/hr (07/07/11 1344)   PRN Meds:.acetaminophen, acetaminophen, albuterol, alum & mag hydroxide-simeth, ondansetron (ZOFRAN) IV, ondansetron, sodium phosphate, traMADol, DISCONTD: ondansetron (ZOFRAN) IV  Assessment/Plan:  Principal Problem:  *Altered mental status: Patient has no clinical evidence of infection, with normal wcc, urinalysis, CXR. He is also apyrexial. Etiology is likely multifactorial, secondary to polypharmacy, mild dehydration, advanced age and underlying dementia. Brain MRI of 07/07/11, shows atrophy and chronic microvascular ischemia. No acute intracranial abnormality. We shall hold sedating medications for now, and rehydrate. Patient lives alone, and appears unable to cope. Likely, he will benefit from SNF placement. Active Problems:  1. CHF (congestive heart failure): Patient has a known history of chronic systolic heart failure, and appears well compensated at this time.  2. DJD (degenerative joint disease): Not problematic.  3. Vomiting: Per patient, he has had intermittent episodes of vomiting, over the past few days. None recorded since admission, and patient is tolerating clears. Abdominal X-Ray of 07/07/11, shows large stool volume throughout the colon, compatible with clinical constipation. This may be the culprit, and certainly exacerbated by  narcotic medication.. We shall manage with laxatives and advance diet today, as tolerated. 4.Constipation: See above discussion. Now on Miralax/Senna.  5. Falls: This is recurrent, and is likely multifactorial, due to gait instability, age and medication side effect. Fortunately, patient has no bony injuries. For PT/OT evaluation.    LOS: 1 day   Osborn Pullin,CHRISTOPHER 07/08/2011, 8:34 AM

## 2011-07-09 LAB — CBC
MCH: 31 pg (ref 26.0–34.0)
MCHC: 35.6 g/dL (ref 30.0–36.0)
MCV: 86.9 fL (ref 78.0–100.0)
Platelets: 218 10*3/uL (ref 150–400)
RDW: 13.7 % (ref 11.5–15.5)

## 2011-07-09 MED ORDER — BISACODYL 10 MG RE SUPP
10.0000 mg | Freq: Every day | RECTAL | Status: DC
  Administered 2011-07-09: 10 mg via RECTAL
  Filled 2011-07-09: qty 1

## 2011-07-09 MED ORDER — FLEET ENEMA 7-19 GM/118ML RE ENEM
1.0000 | ENEMA | Freq: Once | RECTAL | Status: DC
  Filled 2011-07-09: qty 1

## 2011-07-09 MED ORDER — METOCLOPRAMIDE HCL 5 MG/ML IJ SOLN
5.0000 mg | Freq: Four times a day (QID) | INTRAMUSCULAR | Status: DC
  Administered 2011-07-09 – 2011-07-10 (×5): 5 mg via INTRAVENOUS
  Filled 2011-07-09 (×8): qty 1

## 2011-07-09 NOTE — Progress Notes (Signed)
Subjective: Vomited overnight, confused this morning. No BM since 07/07/11.  Objective: Vital signs in last 24 hours: Temp:  [98.3 F (36.8 C)-98.9 F (37.2 C)] 98.9 F (37.2 C) (04/28 0500) Pulse Rate:  [50-82] 82  (04/28 0500) Resp:  [18] 18  (04/28 0500) BP: (113-156)/(59-86) 156/86 mmHg (04/28 0500) SpO2:  [91 %-95 %] 95 % (04/28 0500) Weight change:  Last BM Date: 07/07/11  Intake/Output from previous day: 04/27 0701 - 04/28 0700 In: 960 [P.O.:960] Out: 850 [Urine:850]     Physical Exam: General: Comfortable, alert, communicative, confused and disoriented, not short of breath at rest.  HEENT:  No clinical pallor, no jaundice, no conjunctival injection or discharge. Hydration status is fair. NECK:  Supple, JVP not seen, no carotid bruits, no palpable lymphadenopathy, no palpable goiter. CHEST:  Clinically clear to auscultation, no wheezes, no crackles. HEART:  Sounds 1 and 2 heard, normal, regular, no murmurs. ABDOMEN:  Full, soft, non-tender, no palpable organomegaly, no palpable masses, normal bowel sounds. GENITALIA:  Not examined. LOWER EXTREMITIES:  No pitting edema, palpable peripheral pulses. MUSCULOSKELETAL SYSTEM:  Generalized osteoarthritic changes, otherwise, normal. Has abrasion left elbow. CENTRAL NERVOUS SYSTEM:  No focal neurologic deficit on gross examination.  Lab Results:  Basename 07/09/11 0555 07/07/11 1746  WBC 7.0 6.3  HGB 13.5 13.9  HCT 37.9* 39.2  PLT 218 205    Basename 07/08/11 0940 07/07/11 1746 07/07/11 1140  NA 134* -- 138  K 4.4 -- 4.3  CL 99 -- 101  CO2 21 -- 23  GLUCOSE 103* -- 119*  BUN 15 -- 13  CREATININE 0.82 0.86 --  CALCIUM 9.6 -- 9.6   No results found for this or any previous visit (from the past 240 hour(s)).   Studies/Results: Ct Head Wo Contrast  07/07/2011  *RADIOLOGY REPORT*  Clinical Data:  Found on floor.  Generalized weakness.  CT HEAD WITHOUT CONTRAST CT CERVICAL SPINE WITHOUT CONTRAST  Technique:   Multidetector CT imaging of the head and cervical spine was performed following the standard protocol without intravenous contrast.  Multiplanar CT image reconstructions of the cervical spine were also generated.  Comparison:  05/15/2011.  CT HEAD  Findings: No skull fracture or intracranial hemorrhage.  Partial opacification right mastoid air cells unchanged.  Global atrophy without hydrocephalus.  Small vessel disease type changes without CT evidence of large acute infarct.  Vascular calcifications.  No intracranial mass lesion detected on this unenhanced exam.  IMPRESSION: No skull fracture or intracranial hemorrhage.  Please see above.  CT CERVICAL SPINE  Findings: No cervical spine fracture.  Cervical spondylotic changes with spinal stenosis and cord flattening C4-5 through C6-7.  Cervical kyphosis.  No abnormal prevertebral soft tissue swelling.  IMPRESSION: No cervical spine fracture.  Please see above.  Original Report Authenticated By: Fuller Canada, M.D.   Ct Cervical Spine Wo Contrast  07/07/2011  *RADIOLOGY REPORT*  Clinical Data:  Found on floor.  Generalized weakness.  CT HEAD WITHOUT CONTRAST CT CERVICAL SPINE WITHOUT CONTRAST  Technique:  Multidetector CT imaging of the head and cervical spine was performed following the standard protocol without intravenous contrast.  Multiplanar CT image reconstructions of the cervical spine were also generated.  Comparison:  05/15/2011.  CT HEAD  Findings: No skull fracture or intracranial hemorrhage.  Partial opacification right mastoid air cells unchanged.  Global atrophy without hydrocephalus.  Small vessel disease type changes without CT evidence of large acute infarct.  Vascular calcifications.  No intracranial mass lesion detected on this  unenhanced exam.  IMPRESSION: No skull fracture or intracranial hemorrhage.  Please see above.  CT CERVICAL SPINE  Findings: No cervical spine fracture.  Cervical spondylotic changes with spinal stenosis and cord  flattening C4-5 through C6-7.  Cervical kyphosis.  No abnormal prevertebral soft tissue swelling.  IMPRESSION: No cervical spine fracture.  Please see above.  Original Report Authenticated By: Fuller Canada, M.D.   Mr Brain Wo Contrast  07/07/2011  *RADIOLOGY REPORT*  Clinical Data: Increasing Falls.  Altered mental status.  MRI HEAD WITHOUT CONTRAST  Technique:  Multiplanar, multiecho pulse sequences of the brain and surrounding structures were obtained according to standard protocol without intravenous contrast.  Comparison: CT head 07/07/2011  Findings: Moderate atrophy.  Mild to moderate chronic microvascular ischemia in the white matter.  Brainstem is intact.  Negative for acute infarct.  Negative for hemorrhage or mass.  No subdural hemorrhage or fluid collection is identified.  Right mastoid sinus effusion.  Left mastoid sinuses clear. Paranasal sinuses are clear.  IMPRESSION: Atrophy and chronic microvascular ischemia.  No acute intracranial abnormality.  Original Report Authenticated By: Camelia Phenes, M.D.   Dg Abd Acute W/chest  07/07/2011  *RADIOLOGY REPORT*  Clinical Data: Abdominal pain with vomiting.  ACUTE ABDOMEN SERIES (ABDOMEN 2 VIEW & CHEST 1 VIEW)  Comparison: 07/01/2011  Findings: Hyperexpansion is consistent with emphysema. Interstitial markings are diffusely coarsened with chronic features. The cardiopericardial silhouette is enlarged.  Prominence of the transverse aorta raises the question of aneurysm.  Right side up decubitus film shows no evidence for intraperitoneal free air.  Supine film shows no gaseous small bowel dilatation to suggest obstruction.  The patient does have a large volume of stool scattered along the entire length of the colon, as previously. Diffuse degenerative changes are noted the lumbar spine.  IMPRESSION: Emphysema without acute cardiopulmonary findings.  No intraperitoneal free air.  Large stool volume throughout the colon.  Imaging features are compatible  with clinical constipation.  Original Report Authenticated By: ERIC A. MANSELL, M.D.    Medications: Scheduled Meds:    . donepezil  10 mg Oral QHS  . enoxaparin  40 mg Subcutaneous QHS  . finasteride  5 mg Oral Daily  . mulitivitamin with minerals  1 tablet Oral Daily  . polyethylene glycol  17 g Oral BID  . senna  1 tablet Oral QHS  . sodium chloride  3 mL Intravenous Q12H   Continuous Infusions:    . sodium chloride 75 mL/hr at 07/08/11 0900   PRN Meds:.acetaminophen, acetaminophen, albuterol, alum & mag hydroxide-simeth, ondansetron (ZOFRAN) IV, ondansetron, sodium phosphate  Assessment/Plan:  Principal Problem:  *Altered mental status: Patient had no clinical evidence of infection on presentation, with normal wcc, urinalysis, CXR. He has remained apyrexial. Etiology is likely multifactorial, secondary to polypharmacy, mild dehydration, advanced age and underlying dementia. Brain MRI of 07/07/11, shows atrophy and chronic microvascular ischemia. No acute intracranial abnormality. We shall hold sedating medications for now, and rehydrate. Patient lives alone, and appears unable to cope. Likely, he will benefit from SNF placement. Active Problems:  1. CHF (congestive heart failure): Patient has a known history of chronic systolic heart failure, and appears well compensated at this time.  2. DJD (degenerative joint disease): Not problematic.  3. Vomiting: Patient has had intermittent episodes of vomiting, over the past few days, and has vomited again overnight. Abdominal X-Ray of 07/07/11, shows large stool volume throughout the colon, compatible with clinical constipation. This may be the culprit, and certainly exacerbated  by narcotic medication. Meanwhile, we shall commence Reglan. If vomiting persists, GI consult will be considered.  4.Constipation: See above discussion. Now on Miralax/Senna. Opioids are on hold. Patient has had no bowel movement since 07/07/11, despite laxatives. We  shall try Dulcolax suppositories, and if that does not work, we shall utilize [employer].   5. Mild dehydration: This is being addressed with iv fluids, and hydration is satisfactory at this time.  6. Falls: This is recurrent, and is likely multifactorial, due to gait instability, age and medication side effect. Fortunately, patient has no bony injuries.     LOS: 2 days   Russell Fisher,CHRISTOPHER 07/09/2011, 9:32 AM

## 2011-07-10 LAB — GLUCOSE, CAPILLARY
Glucose-Capillary: 98 mg/dL (ref 70–99)
Glucose-Capillary: 89 mg/dL (ref 70–99)

## 2011-07-10 LAB — CBC
HCT: 38.9 % — ABNORMAL LOW (ref 39.0–52.0)
Hemoglobin: 13.3 g/dL (ref 13.0–17.0)
MCHC: 34.2 g/dL (ref 30.0–36.0)
MCV: 88.4 fL (ref 78.0–100.0)
RDW: 13.5 % (ref 11.5–15.5)

## 2011-07-10 LAB — BASIC METABOLIC PANEL
BUN: 13 mg/dL (ref 6–23)
Chloride: 100 mEq/L (ref 96–112)
Creatinine, Ser: 0.65 mg/dL (ref 0.50–1.35)
GFR calc Af Amer: >90 mL/min (ref >90)
GFR calc non Af Amer: 84 mL/min — ABNORMAL LOW (ref >90)
Glucose, Bld: 98 mg/dL (ref 70–99)
Potassium: 3.8 mEq/L (ref 3.5–5.1)

## 2011-07-10 MED ORDER — AMLODIPINE BESYLATE 5 MG PO TABS
5.0000 mg | ORAL_TABLET | Freq: Every day | ORAL | Status: DC
  Administered 2011-07-10: 5 mg via ORAL
  Filled 2011-07-10 (×2): qty 1

## 2011-07-10 MED ORDER — METOCLOPRAMIDE HCL 5 MG/ML IJ SOLN
5.0000 mg | Freq: Four times a day (QID) | INTRAMUSCULAR | Status: DC | PRN
  Filled 2011-07-10: qty 1

## 2011-07-10 NOTE — Progress Notes (Signed)
Physical Therapy Treatment Patient Details Name: Russell Fisher MRN: [id number] DOB: 08-12-1920 Today's Date: 07/10/2011 Time: 4098-1191 PT Time Calculation (min): 27 min  PT Assessment / Plan / Recommendation Comments on Treatment Session       Follow Up Recommendations  Home health PT;Supervision/Assistance - 24 hour    Equipment Recommendations  None recommended by PT    Frequency Min 3X/week   Plan Discharge plan remains appropriate;Frequency remains appropriate    Precautions / Restrictions Precautions Precautions: Fall Restrictions Weight Bearing Restrictions: No   Pertinent Vitals/Pain Pt denied  Pain.     Mobility  Bed Mobility Bed Mobility: Supine to Sit;Sit to Supine Supine to Sit: 6: Modified independent (Device/Increase time);HOB elevated Sit to Supine: 6: Modified independent (Device/Increase time);HOB flat;With rail Transfers Transfers: Sit to Stand;Stand to Sit Sit to Stand: With upper extremity assist;4: Min assist;With armrests;From bed;From chair/3-in-1 Stand to Sit: 5: Supervision;To bed;To chair/3-in-1;With armrests;With upper extremity assist Details for Transfer Assistance: cues for hand placement assist to steady pt to stand.  Ambulation/Gait Ambulation/Gait Assistance: 4: Min guard Ambulation Distance (Feet): 30 Feet Assistive device: Rolling walker Ambulation/Gait Assistance Details: cues for proper distancing from RW and upright trunk posture.  Gait Pattern: Step-to pattern Stairs: No Wheelchair Mobility Wheelchair Mobility: No    Exercises     PT Goals Acute Rehab PT Goals PT Goal Formulation: With patient Time For Goal Achievement: 07/15/11 Potential to Achieve Goals: Good Pt will go Supine/Side to Sit: with modified independence PT Goal: Supine/Side to Sit - Progress: Met Pt will Sit at Edge of Bed: with modified independence;1-2 min PT Goal: Sit at Edge Of Bed - Progress: Met Pt will go Sit to Supine/Side: with modified  independence PT Goal: Sit to Supine/Side - Progress: Met Pt will go Sit to Stand: with modified independence;from elevated surface PT Goal: Sit to Stand - Progress: Progressing toward goal Pt will go Stand to Sit: with modified independence PT Goal: Stand to Sit - Progress: Progressing toward goal Pt will Transfer Bed to Chair/Chair to Bed: with modified independence PT Transfer Goal: Bed to Chair/Chair to Bed - Progress: Progressing toward goal Pt will Ambulate: >150 feet;with modified independence;with rolling walker PT Goal: Ambulate - Progress: Progressing toward goal Pt will Go Up / Down Stairs: 1-2 stairs;with supervision;with rail(s) PT Goal: Up/Down Stairs - Progress: Not met  Visit Information  Last PT Received On: 07/10/11    Subjective Data  Subjective: "I'm weak from laying in this bed and I fell because I go up to fast." Patient Stated Goal: Did not set   Cognition  Overall Cognitive Status: Appears within functional limits for tasks assessed/performed Arousal/Alertness: Awake/alert Behavior During Session: [facility] for tasks performed    Balance  Balance Balance Assessed: No  End of Session PT - End of Session Equipment Utilized During Treatment: Gait belt Activity Tolerance: Patient limited by fatigue Patient left: in bed;with bed alarm set;with call bell/phone within reach Nurse Communication: Mobility status;Other (comment)    Ryley Bachtel 07/10/2011, 5:41 PM Kimorah Ridolfi L. Omkar Stratmann DPT [phone]

## 2011-07-10 NOTE — Progress Notes (Signed)
Utilization Review Completed.Russell Fisher T4/29/2013   

## 2011-07-10 NOTE — Progress Notes (Signed)
Clinical Social Work Department BRIEF PSYCHOSOCIAL ASSESSMENT 07/10/2011  Patient:  Russell Fisher, Russell Fisher     Account Number:  [id number]     Admit date:  07/07/2011  Clinical Social Worker:  Jacelyn Grip  Date/Time:  07/10/2011 04:30 PM  Referred by:  Physician  Date Referred:  07/10/2011 Referred for  SNF Placement   Other Referral:   Interview type:  Other - See comment Other interview type:   patient HCPOA    PSYCHOSOCIAL DATA Living Status:  ALONE Admitted from facility:   Level of care:   Primary support name:  Russell Fisher,Russell Fisher/[phone] Primary support relationship to patient:  FRIEND Degree of support available:   adequate    CURRENT CONCERNS Current Concerns  Post-Acute Placement   Other Concerns:    SOCIAL WORK ASSESSMENT / PLAN CSW contacted pt friend/caregiver, Russell Fisher to discuss disposition planning. Russell Fisher discussed that pt lives alone and she and her husband provide assistance, but are unable to provide 24 hour supervision. Russell Fisher stated that they had been looking into [facility] for pt prior to pt admission. CSW discussed the option for SNF for rehab and Russell Fisher stated that the pt would not participate and feels pt would be "unhappy" at SNF. Russell Fisher stated that she plans to meet with [facility] tomorrow and contact this CSW following meeting. Russell Fisher is also agreeable to meeting with this CSW and Case Manager to discuss other options for home such as private duty care services along with [facility] services. CSW to continue to follow to assist with disposition planning      78yo male patient admitted on 07/07/11 with altered mental status after a fall. Prior to admission, patient lived at home alone. Independent with ADLs. PT/OT evaluation recommended home health PT with 24 hours supervision.Marland Kitchen Possible SNF placement for rehab due to lack of support systems at home. CM will continue to monitor for discharge/home needs    Assessment/plan status:  Other - See comment Other assessment/ plan:   discharge planning   Information/referral to community resources:   none at this time    PATIENT'S/FAMILY'S RESPONSE TO PLAN OF CARE: Per RN, pt oriented to self only. Pt friend/caregivers supportive and aware of what pt would be comfortable with as far as dispostion and advocating on pt behalf.     Russell Fisher, MSW, LCSWA  Clinical Social Work [phone]

## 2011-07-10 NOTE — Progress Notes (Signed)
Subjective: Less confused this morning, no vomiting recorded since yesterday.  Objective: Vital signs in last 24 hours: Temp:  [97.4 F (36.3 C)-99.3 F (37.4 C)] 98.6 F (37 C) (04/29 0500) Pulse Rate:  [79-92] 79  (04/29 0500) Resp:  [16-20] 16  (04/29 0500) BP: (140-162)/(83-93) 149/83 mmHg (04/29 0500) SpO2:  [96 %] 96 % (04/29 0500) Weight change:  Last BM Date: 07/10/11  Intake/Output from previous day: 04/28 0701 - 04/29 0700 In: 907 [I.V.:903; IV Piggyback:4] Out: 1925 [Urine:1925]     Physical Exam: General: Comfortable, alert, communicative, confused and disoriented, not short of breath at rest.  HEENT:  No clinical pallor, no jaundice, no conjunctival injection or discharge. Hydration status is fair. NECK:  Supple, JVP not seen, no carotid bruits, no palpable lymphadenopathy, no palpable goiter. CHEST:  Clinically clear to auscultation, no wheezes, no crackles. HEART:  Sounds 1 and 2 heard, normal, regular, no murmurs. ABDOMEN:  Full, soft, non-tender, no palpable organomegaly, no palpable masses, normal bowel sounds. GENITALIA:  Not examined. LOWER EXTREMITIES:  No pitting edema, palpable peripheral pulses. MUSCULOSKELETAL SYSTEM:  Generalized osteoarthritic changes, otherwise, normal. Has abrasion left elbow. CENTRAL NERVOUS SYSTEM:  No focal neurologic deficit on gross examination.  Lab Results:  Basename 07/10/11 0555 07/09/11 0555  WBC 4.9 7.0  HGB 13.3 13.5  HCT 38.9* 37.9*  PLT 218 218    Basename 07/10/11 0555 07/08/11 0940  NA 133* 134*  K 3.8 4.4  CL 100 99  CO2 22 21  GLUCOSE 98 103*  BUN 13 15  CREATININE 0.65 0.82  CALCIUM 8.8 9.6   No results found for this or any previous visit (from the past 240 hour(s)).   Studies/Results: No results found.  Medications: Scheduled Meds:    . bisacodyl  10 mg Rectal Daily  . donepezil  10 mg Oral QHS  . enoxaparin  40 mg Subcutaneous QHS  . finasteride  5 mg Oral Daily  . metoCLOPramide  (REGLAN) injection  5 mg Intravenous Q6H  . mulitivitamin with minerals  1 tablet Oral Daily  . polyethylene glycol  17 g Oral BID  . senna  1 tablet Oral QHS  . sodium chloride  3 mL Intravenous Q12H  . sodium phosphate  1 enema Rectal Once   Continuous Infusions:    . sodium chloride 75 mL/hr at 07/09/11 1619   PRN Meds:.acetaminophen, acetaminophen, albuterol, alum & mag hydroxide-simeth, ondansetron (ZOFRAN) IV, ondansetron, sodium phosphate  Assessment/Plan:  Principal Problem:  *Altered mental status: Patient had no clinical evidence of infection on presentation, with normal wcc, urinalysis, CXR. He has remained apyrexial. Etiology is likely multifactorial, secondary to polypharmacy, mild dehydration, advanced age and underlying dementia. Brain MRI of 07/07/11, shows atrophy and chronic microvascular ischemia. No acute intracranial abnormality. We shall hold sedating medications for now, and rehydrate. Patient lives alone, and appears unable to cope. Likely, he will benefit from SNF placement. Active Problems:  1. CHF (congestive heart failure): Patient has a known history of chronic systolic heart failure, and appears well compensated at this time.  2. DJD (degenerative joint disease): Not problematic.  3. Vomiting: Patient has had intermittent episodes of vomiting, over the past few days, and has vomited again overnight. Abdominal X-Ray of 07/07/11, shows large stool volume throughout the colon, compatible with clinical constipation. This may be the culprit, and certainly exacerbated by narcotic medication. We commenced Reglan on 07/09/11, with good effect. If vomiting persists, GI consult will be considered. Patient has had no further  vomiting, since ward round on 07/09/11.  4.Constipation: See above discussion. Now on Miralax/Senna. Opioids are on hold. Patient had no bowel movement since 07/07/11, despite laxatives, so on 07/09/11, he was placed on Dulcolax suppositories, which resulted in  2 large bowel movement s in early AM today. Prn Fleets enema has been ordered.   5. Mild dehydration: This is being addressed with iv fluids, and hydration is satisfactory at this time. Will likely discontinue iv fluids on 07/10/11, if no further vomiting.  6. Falls: This is recurrent, and is likely multifactorial, due to gait instability, age and medication side effect. Fortunately, patient has no bony injuries.   Comment: Aiming discharge in next couple of days.   LOS: 3 days   Siria Calandro,CHRISTOPHER 07/10/2011, 12:59 PM

## 2011-07-10 NOTE — Progress Notes (Signed)
07/10/2011 [facility], Bosie Clos SPARKS Case Management Note [phone]    CARE MANAGEMENT NOTE 07/10/2011  Patient:  ANTHEM, FRAZER   Account Number:  [id number]  Date Initiated:  07/10/2011  Documentation initiated by:  Fransico Michael  Subjective/Objective Assessment:   admitted on 07/07/11 with c/o altered mental status     Action/Plan:   CT of head  ABD films   Anticipated DC Date:  07/13/2011   Anticipated DC Plan:  SKILLED NURSING FACILITY      DC Planning Services  CM consult      Choice offered to / List presented to:             Status of service:  In process, will continue to follow Medicare Important Message given?   (If response is "NO", the following Medicare IM given date fields will be blank) Date Medicare IM given:   Date Additional Medicare IM given:    Discharge Disposition:    Per UR Regulation:  Reviewed for med. necessity/level of care/duration of stay  If discussed at Long Length of Stay Meetings, dates discussed:    Comments:  PCP: SHAW,KIMBERLEE  Contact: MOODY,JIM Prudencio Burly [phone]  07/10/11-1207-J.Lutricia Horsfall  [phone]      78yo male patient admitted on 07/07/11 with altered mental status after a fall. Prior to admission, patient lived at home alone. Independent with ADLs. PT/OT evaluation recommended home health PT with 24 hours supervision.Marland Kitchen Possible SNF placement for rehab due to lack of support systems at home. CM will continue to monitor for discharge/home needs.

## 2011-07-11 LAB — BASIC METABOLIC PANEL
CO2: 22 mEq/L (ref 19–32)
Chloride: 95 mEq/L — ABNORMAL LOW (ref 96–112)
Creatinine, Ser: 0.62 mg/dL (ref 0.50–1.35)
GFR calc Af Amer: >90 mL/min (ref >90)
Potassium: 4 mEq/L (ref 3.5–5.1)

## 2011-07-11 LAB — GLUCOSE, CAPILLARY: Glucose-Capillary: 122 mg/dL — ABNORMAL HIGH (ref 70–99)

## 2011-07-11 MED ORDER — SODIUM CHLORIDE 0.9 % IJ SOLN
10.0000 mL | Freq: Two times a day (BID) | INTRAMUSCULAR | Status: DC
  Administered 2011-07-15 – 2011-07-16 (×3): 10 mL via INTRAVENOUS

## 2011-07-11 MED ORDER — AMLODIPINE BESYLATE 10 MG PO TABS
10.0000 mg | ORAL_TABLET | Freq: Every day | ORAL | Status: DC
  Administered 2011-07-11 – 2011-07-17 (×7): 10 mg via ORAL
  Filled 2011-07-11 (×8): qty 1

## 2011-07-11 NOTE — Consult Note (Signed)
Consult Note from the Palliative Medicine Team at [facility] Patient [name]      DOB: 1920-06-17      VWU:[id number]   Consult Requested by: Dr Isidor Holts     PCP: Russell Raider, MD, MD Reason for Consultation:Goals of Care/Sympt Management             Phone [phone]  After chart review and report from primary nurse caring for patient meeting held with patient, Russell Fisher [facility]) and his wife Russell Fisher, they have know Russell. Stoney Fisher for the past 40 years, and live within a 1 mile proximity of patients home. Patient stated he has no family or children. According to the [employer], Russell Fisher worked in [employer] business prior to his retirement several years ago. Patient has been living alone in his home, and has sustained several falls wihin the past year. According to the [employer] he gets mildly confused at times and they have concerns about him returning home for safety reasons, and are pursuing ALF or SNF placement.  Discussion was held with the patient and the [employer] about regarding advanced directives, in the form of Medical Orders for Scope of Treatment. Encompassed in the discussion and decisions were wishes pertaining to cardio-pulmonary resuscitation, medical interventions,use of antibiotic therapy, medically administered fluids and nutrition (IV fluid therapy and feeding tubes) Patient does have an existing living will that parallels decisions made today. Also discussed was the concepts of palliative care as an added benefit once patient is discharged to an assisted or skilled living facility, and how this service can be procured.   Russell Fisher responses were appropriate during the above discussion, he appeared comfortable and agreeable with decisions that were discussed and agreed upon. Overall physical status stable at present, nutritional intake is variable, with small percentages of intake at certain meals.  Assessment and Plan: 1. Code  Status:DNR/DNI 2. Symptom Control: Comfortable presently no symptom intervention recommended at this time. Upon review of home medication list several medications alone and in combination may be attributing to patients falls (Xanax-Elavil, Clonazepam and Tramadol).  3. Psycho/Social: emotional support and assistance. Discussed need for skilled placement with patient at this time, because of the safety concerns related to his history of falls. Patient agreeable with plan for discharge to ALF or SNF. 4. Spiritual: Refused chaplin consult, has community preacher  5. Disposition: plan is for possible discharge to ALF or SNF. Health care surrogate would be interested Palliative Care Services to be consulted to follow patient upon discharge.  Patient Documents Completed or Given: Document Given Completed  Advanced Directives Pkt    MOST      Yes  DNR      Yes  Gone from My Sight    Hard Choices      Brief HPI: Patient is an 78 yo WM admitted 07/07/11 s/p fall with AMS changes. Past medical history pertinent for mild dementia, and CHF (currently compensated). AMS changes this admission per medicine team were likely secondary to polypharmacy, mild dehydration, advanced age and mild dementia.    ROS: Currently patient denies pain, depression, anxiety, dyspnea, constipation or diarrhea    PMH:  Past Medical History  Diagnosis Date  . DJD (degenerative joint disease)   . Anxiety   . Depression   . Insomnia   . Duodenal ulcer   . Bronchiectasis     ct chest 05/09/10  . Systolic heart failure     with ejection fracture of 30%  . Aortic regurgitation  mild to moderate  . Tricuspid regurgitation     mild to moderate  . CHF (congestive heart failure)      PSH: Past Surgical History  Procedure Date  . Left hydrocele   . Hernia repair   . Hemorrhoid surgery   . Total knee arthroplasty     right  . Appendectomy   . Bilateral cataract repair    I have reviewed the FH and SH and  If  appropriate update it with new information. Allergies  Allergen Reactions  . Penicillins     REACTION: swelling   Scheduled Meds:   . amLODipine  10 mg Oral q1800  . donepezil  10 mg Oral QHS  . enoxaparin  40 mg Subcutaneous QHS  . finasteride  5 mg Oral Daily  . mulitivitamin with minerals  1 tablet Oral Daily  . polyethylene glycol  17 g Oral BID  . senna  1 tablet Oral QHS  . sodium chloride  10 mL Intravenous Q12H  . sodium chloride  3 mL Intravenous Q12H  . sodium phosphate  1 enema Rectal Once  . DISCONTD: amLODipine  5 mg Oral q1800  . DISCONTD: bisacodyl  10 mg Rectal Daily  . DISCONTD: metoCLOPramide (REGLAN) injection  5 mg Intravenous Q6H   Continuous Infusions:   . DISCONTD: sodium chloride 75 mL/hr at 07/11/11 0725   PRN Meds:.acetaminophen, acetaminophen, albuterol, alum & mag hydroxide-simeth, metoCLOPramide (REGLAN) injection, ondansetron (ZOFRAN) IV, ondansetron, sodium phosphate    BP 130/80  Pulse 83  Temp(Src) 98.3 F (36.8 C) (Oral)  Resp 18  Ht 5' 8.5" (1.74 m)  Wt 73.483 kg (162 lb)  BMI 24.27 kg/m2  SpO2 96%   PPS: 60% PT Evaluation 07/10/11: Supervision/Assistance-24 hr; consider patient to be a Fall Risk   Intake/Output Summary (Last 24 hours) at 07/11/11 1351 Last data filed at 07/11/11 0900  Gross per 24 hour  Intake   1065 ml  Output   2600 ml  Net  -1535 ml   LBM: prior to admission 07/07/11                    Stool Softner:Senokot , Miralax  Physical Exam:  General: Pleasant WM sitting up in bed in NAD HEENT:  Anicteric, buccal mucosa moist Chest: CTA bilaterally CVS: RRR, S1/S2, no MGR Abdomen:soft, non-tender, BS audible Ext: warm to touch, no edema, no calf tenderness Neuro:alert, oriented to place and person  Labs: CBC    Component Value Date/Time   WBC 4.9 07/10/2011 0555   RBC 4.40 07/10/2011 0555   HGB 13.3 07/10/2011 0555   HCT 38.9* 07/10/2011 0555   PLT 218 07/10/2011 0555   MCV 88.4 07/10/2011 0555   MCH 30.2  07/10/2011 0555   MCHC 34.2 07/10/2011 0555   RDW 13.5 07/10/2011 0555   LYMPHSABS 1.0 07/01/2011 2220   MONOABS 0.7 07/01/2011 2220   EOSABS 0.4 07/01/2011 2220   BASOSABS 0.0 07/01/2011 2220    BMET    Component Value Date/Time   NA 131* 07/11/2011 0540   K 4.0 07/11/2011 0540   CL 95* 07/11/2011 0540   CO2 22 07/11/2011 0540   GLUCOSE 81 07/11/2011 0540   BUN 11 07/11/2011 0540   CREATININE 0.62 07/11/2011 0540   CALCIUM 9.2 07/11/2011 0540   GFRNONAA 86* 07/11/2011 0540   GFRAA >90 07/11/2011 0540    CMP     Component Value Date/Time   NA 131* 07/11/2011 0540   K 4.0 07/11/2011  0540   CL 95* 07/11/2011 0540   CO2 22 07/11/2011 0540   GLUCOSE 81 07/11/2011 0540   BUN 11 07/11/2011 0540   CREATININE 0.62 07/11/2011 0540   CALCIUM 9.2 07/11/2011 0540   PROT 7.2 07/07/2011 1140   ALBUMIN 3.5 07/07/2011 1140   AST 26 07/07/2011 1140   ALT 18 07/07/2011 1140   ALKPHOS 143* 07/07/2011 1140   BILITOT 1.1 07/07/2011 1140   GFRNONAA 86* 07/11/2011 0540   GFRAA >90 07/11/2011 0540    CT scan of the Head Reviewed/Impressions:07/07/11 IMPRESSION:  No skull fracture or intracranial hemorrhage.    Time In Time Out Total Time Spent with Patient Total Overall Time  1:00p 2:15p 60 min 75 min    Greater than 50%  of this time was spent counseling and coordinating care related to the above assessment and plan.    Darin Engels, Dyann Ruddle Palliative Medicine Team [place] Team Phone: [phone] Pager: [phone]          Agree with above

## 2011-07-11 NOTE — Progress Notes (Signed)
Patient VH:Russell Fisher      DOB: 03/20/1920      XBM:[id number]  Chartt reviewed palliave consult order in chart.Patient alert this morning, informed him that a meeting will be scheduled to discuss GOC with Trecia Rogers (MPOA), he verbalized his understanding. Called Trecia Rogers, GOC meeting scheduled today at 1pm with Palliative Care Service

## 2011-07-11 NOTE — Progress Notes (Addendum)
Clinical Social Work Department CLINICAL SOCIAL WORK PLACEMENT NOTE 07/11/2011  Patient:  Russell Fisher, Russell Fisher  Account Number:  [id number] Admit date:  07/07/2011  Clinical Social Worker:  Jacelyn Grip  Date/time:  07/11/2011 02:30 PM  Clinical Social Work is seeking post-discharge placement for this patient at the following level of care:   ASSISTED LIVING/REST HOME   (*CSW will update this form in Epic as items are completed)     Patient/family provided with [facility] Department of Clinical Social Work's list of facilities offering this level of care within the geographic area requested by the patient (or if unable, by the patient's family).  07/11/2011  Patient/family informed of their freedom to choose among providers that offer the needed level of care, that participate in Medicare, Medicaid or managed care program needed by the patient, have an available bed and are willing to accept the patient.    Patient/family informed of [facility]' ownership interest in [facility], as well as of the fact that they are under no obligation to receive care at this facility.  PASARR submitted to EDS on 07/11/2011 PASARR number received from EDS on 07/13/2011  FL2 transmitted to all facilities in geographic area requested by pt/family on  07/11/2011 FL2 transmitted to all facilities within larger geographic area on   Patient informed that his/her managed care company has contracts with or will negotiate with  certain facilities, including the following:     Patient/family informed of bed offers received: 07/14/2011 Patient chooses bed at [facility] Physician recommends and patient chooses bed at    Patient to be transferred to  on  [facility] on 07/17/2011 Patient to be transferred to facility by ambulance  The following physician request were entered in Epic:   Additional Comments: Offered list of Assisted Living Facilities, but pt caregivers only interested  in [facility].   Jacklynn Lewis, MSW, LCSWA  Clinical Social Work [phone]

## 2011-07-11 NOTE — Progress Notes (Signed)
Clinical Social Worker and Sports coach met with pt and pt HCPOA and caregivers, Britta Mccreedy and Trecia Rogers at bedside to discuss disposition. Ms. Mitzi Hansen discussed that she spoke with [facility] this morning and facility asked for pt FL2 and medical information be faxed to [facility] for facility to review. Pt and pt caregivers preference is [facility] with Home Health services and would like to explore that option first before exploring short term rehab at [facility] option. Clinical Social Worker completed [employer] and faxed clinical information to [facility] and contacted facility who confirmed information was received and facility RN is reviewing information and aiming to have liaison from [facility] come to assess pt tomorrow. Clinical Social Worker to continue to follow and facilitate pt discharge needs when pt medically stable for discharge.  Jacklynn Lewis, MSW, LCSWA  Clinical Social Work [phone]

## 2011-07-11 NOTE — Progress Notes (Signed)
Subjective: Now having regular bowel movement, no further vomiting. Poor oral intake.  Objective: Vital signs in last 24 hours: Temp:  [98.5 F (36.9 C)-99.1 F (37.3 C)] 98.5 F (36.9 C) (04/30 0619) Pulse Rate:  [83-85] 84  (04/30 0619) Resp:  [17-20] 20  (04/30 0619) BP: (161-171)/(66-92) 171/91 mmHg (04/30 0619) SpO2:  [94 %-97 %] 97 % (04/30 0619) Weight change:  Last BM Date: 07/10/11  Intake/Output from previous day: 04/29 0701 - 04/30 0700 In: 945 [P.O.:120; I.V.:825] Out: 2600 [Urine:2600] Total I/O In: 240 [P.O.:240] Out: -    Physical Exam: General: Comfortable, not short of breath at rest.  HEENT:  No clinical pallor, no jaundice, no conjunctival injection or discharge. Hydration status is fair. NECK:  Supple, JVP not seen, no carotid bruits, no palpable lymphadenopathy, no palpable goiter. CHEST:  Clinically clear to auscultation, no wheezes, no crackles. HEART:  Sounds 1 and 2 heard, normal, regular, no murmurs. ABDOMEN:  Full, soft, non-tender, no palpable organomegaly, no palpable masses, normal bowel sounds. GENITALIA:  Not examined. LOWER EXTREMITIES:  No pitting edema, palpable peripheral pulses. MUSCULOSKELETAL SYSTEM:  Generalized osteoarthritic changes, otherwise, normal. Has abrasion left elbow. CENTRAL NERVOUS SYSTEM:  No focal neurologic deficit on gross examination.  Lab Results:  Basename 07/10/11 0555 07/09/11 0555  WBC 4.9 7.0  HGB 13.3 13.5  HCT 38.9* 37.9*  PLT 218 218    Basename 07/11/11 0540 07/10/11 0555  NA 131* 133*  K 4.0 3.8  CL 95* 100  CO2 22 22  GLUCOSE 81 98  BUN 11 13  CREATININE 0.62 0.65  CALCIUM 9.2 8.8   No results found for this or any previous visit (from the past 240 hour(s)).   Studies/Results: No results found.  Medications: Scheduled Meds:    . amLODipine  5 mg Oral q1800  . bisacodyl  10 mg Rectal Daily  . donepezil  10 mg Oral QHS  . enoxaparin  40 mg Subcutaneous QHS  . finasteride  5 mg Oral  Daily  . mulitivitamin with minerals  1 tablet Oral Daily  . polyethylene glycol  17 g Oral BID  . senna  1 tablet Oral QHS  . sodium chloride  3 mL Intravenous Q12H  . sodium phosphate  1 enema Rectal Once  . DISCONTD: metoCLOPramide (REGLAN) injection  5 mg Intravenous Q6H   Continuous Infusions:    . sodium chloride 75 mL/hr at 07/11/11 0725   PRN Meds:.acetaminophen, acetaminophen, albuterol, alum & mag hydroxide-simeth, metoCLOPramide (REGLAN) injection, ondansetron (ZOFRAN) IV, ondansetron, sodium phosphate  Assessment/Plan:  Principal Problem:  *Altered mental status: Patient had no clinical evidence of infection on presentation, with normal wcc, urinalysis, CXR. He has remained apyrexial. Etiology is likely multifactorial, secondary to polypharmacy, mild dehydration, advanced age and underlying dementia. Brain MRI of 07/07/11, shows atrophy and chronic microvascular ischemia. No acute intracranial abnormality. We have held sedating medications for now, and rehydrated. Patient lives alone, and appears unable to cope. Likely, he will benefit from SNF placement. Active Problems:  1. CHF (congestive heart failure): Patient has a known history of chronic systolic heart failure, and appears well compensated at this time.  2. DJD (degenerative joint disease): Not problematic.  3. Vomiting: Patient has had intermittent episodes of vomiting, over the past few days, and has vomited again overnight. Abdominal X-Ray of 07/07/11, showed large stool volume throughout the colon, compatible with clinical constipation. This may be the culprit, and certainly exacerbated by narcotic medication. We commenced Reglan on 07/09/11, with  good effect, and patient has had no vomiting since ward round on 07/09/11. Reglan is now prn only.  4.Constipation: See above discussion. Now on Miralax/Senna. Opioids are on hold. Patient had no bowel movement since 07/07/11, despite laxatives, so on 07/09/11, he was placed on  Dulcolax suppositories, which resulted in 2 large bowel movement s in early AM today. Prn Fleets enema has not proven necessary. Over the last 2 days, he has had regular bowel movements. Dulcolax suppositories were discontinued on 07/11/11.  5. Mild dehydration: This is being addressed with iv fluids, and hydration is satisfactory at this time.IV  fluids were discontinued on 07/10/11, as no further vomiting.  6. Falls: This is recurrent, and is likely multifactorial, due to gait instability, age and medication side effect. Fortunately, patient has no bony injuries.  7. FTT: Patient has failure to thrive, and in the setting of advanced age and absence of acute pathology, I suspect this is due to dementia. At suggestion of care giver, Britta Mccreedy and Trecia Rogers, palliative care consult has been requested, and GOC meeting will be held today.  Comment: Aiming discharge in next couple of days.   LOS: 4 days   Anoushka Divito,CHRISTOPHER 07/11/2011, 12:06 PM

## 2011-07-12 ENCOUNTER — Encounter (HOSPITAL_COMMUNITY): Payer: Self-pay | Admitting: General Practice

## 2011-07-12 DIAGNOSIS — R42 Dizziness and giddiness: Secondary | ICD-10-CM

## 2011-07-12 DIAGNOSIS — K5900 Constipation, unspecified: Secondary | ICD-10-CM

## 2011-07-12 DIAGNOSIS — R627 Adult failure to thrive: Secondary | ICD-10-CM

## 2011-07-12 DIAGNOSIS — R112 Nausea with vomiting, unspecified: Secondary | ICD-10-CM

## 2011-07-12 DIAGNOSIS — R4182 Altered mental status, unspecified: Secondary | ICD-10-CM

## 2011-07-12 DIAGNOSIS — I509 Heart failure, unspecified: Secondary | ICD-10-CM

## 2011-07-12 LAB — BASIC METABOLIC PANEL
BUN: 13 mg/dL (ref 6–23)
CO2: 24 mEq/L (ref 19–32)
Calcium: 9.2 mg/dL (ref 8.4–10.5)
Chloride: 95 mEq/L — ABNORMAL LOW (ref 96–112)
Creatinine, Ser: 0.7 mg/dL (ref 0.50–1.35)
GFR calc Af Amer: >90 mL/min (ref >90)
GFR calc non Af Amer: 82 mL/min — ABNORMAL LOW (ref >90)
Glucose, Bld: 98 mg/dL (ref 70–99)
Potassium: 3.7 mEq/L (ref 3.5–5.1)
Sodium: 132 mEq/L — ABNORMAL LOW (ref 135–145)

## 2011-07-12 LAB — GLUCOSE, CAPILLARY: Glucose-Capillary: 125 mg/dL — ABNORMAL HIGH (ref 70–99)

## 2011-07-12 NOTE — Progress Notes (Signed)
Subjective: Complaining of some back pain earlier, but no complaints at this time. Chart reviewed.  Objective: Vital signs in last 24 hours: Temp:  [97.8 F (36.6 C)-99.4 F (37.4 C)] 97.8 F (36.6 C) (05/01 1330) Pulse Rate:  [78-81] 81  (05/01 1330) Resp:  [16-18] 16  (05/01 1330) BP: (144-148)/(71-87) 147/87 mmHg (05/01 1330) SpO2:  [95 %-98 %] 97 % (05/01 1330) Weight change:  Last BM Date: 07/11/11  Intake/Output from previous day: 04/30 0701 - 05/01 0700 In: 480 [P.O.:480] Out: 1150 [Urine:1150] Total I/O In: 3 [I.V.:3] Out: -    Physical Exam: General: In no apparent distress  HEENT:  No clinical pallor, no jaundice, no conjunctival injection or discharge. Hydration status is fair. NECK:  Supple, JVP not seen, no carotid bruits, no palpable lymphadenopathy, no palpable goiter. CHEST:  Clinically clear to auscultation, no wheezes, no crackles. HEART: Normal S1-S2, normal, regular, no murmurs. ABDOMEN:  Full, soft, non-tender, no palpable organomegaly, no palpable masses, normal bowel sounds. LOWER EXTREMITIES:  No pitting edema, palpable peripheral pulses. MUSCULOSKELETAL SYSTEM:  Generalized osteoarthritic changes, otherwise, normal. Has abrasion left elbow. CENTRAL NERVOUS SYSTEM:  No focal neurologic deficit on gross examination.  Lab Results:  [facility] 07/10/11 0555  WBC 4.9  HGB 13.3  HCT 38.9*  PLT 218    Basename 07/12/11 0500 07/11/11 0540  NA 132* 131*  K 3.7 4.0  CL 95* 95*  CO2 24 22  GLUCOSE 98 81  BUN 13 11  CREATININE 0.70 0.62  CALCIUM 9.2 9.2   No results found for this or any previous visit (from the past 240 hour(s)).   Studies/Results: No results found.  Medications: Scheduled Meds:    . amLODipine  10 mg Oral q1800  . donepezil  10 mg Oral QHS  . enoxaparin  40 mg Subcutaneous QHS  . finasteride  5 mg Oral Daily  . mulitivitamin with minerals  1 tablet Oral Daily  . polyethylene glycol  17 g Oral BID  . senna  1 tablet  Oral QHS  . sodium chloride  10 mL Intravenous Q12H  . sodium chloride  3 mL Intravenous Q12H  . sodium phosphate  1 enema Rectal Once   Continuous Infusions:   PRN Meds:.acetaminophen, acetaminophen, albuterol, alum & mag hydroxide-simeth, metoCLOPramide (REGLAN) injection, ondansetron (ZOFRAN) IV, ondansetron, sodium phosphate  Assessment/Plan:  Principal Problem:  *Altered mental status: -Etiology is likely multifactorial, secondary to polypharmacy, mild dehydration, advanced age and underlying dementia. Patient had no clinical evidence of infection on presentation, with normal wcc, urinalysis, CXR. He has remained afebrile.  Brain MRI of 07/07/11, shows atrophy and chronic microvascular ischemia. No acute intracranial abnormality. -continue holding sedating medications for now, . Patient lives alone, and appears unable to cope.  -Child psychotherapist assisting with placement of an assisted living facility. Active Problems:  1. CHF (congestive heart failure): Patient has a known history of chronic systolic heart failure, and appears well compensated at this time.  2. DJD (degenerative joint disease): Not problematic. Tylenol when necessary  3. Vomiting: Resolved -Patient had intermittent episodes of vomiting -Abdominal X-Ray of 07/07/11, showed large stool volume throughout the colon, compatible with clinical constipation. This may be the culprit, and certainly exacerbated by narcotic medication. Started Reglan on 07/09/11, with good effect, and patient has had no further vomiting. Reglan is now prn only.  4.Constipation: See above discussion. Now on Miralax/Senna. Opioids are on hold. Patient had no bowel movement since 07/07/11, despite laxatives, so on 07/09/11, he was placed  on Dulcolax suppositories, which resulted in 2 large bowel movement s in early AM today. Prn Fleets enema has not proven necessary. Over the last 2 days, he has had regular bowel movements. Dulcolax suppositories were  discontinued on 07/11/11.  5. Mild dehydration: Status post hydration with IV fluids.  6. Falls: This is recurrent, and is likely multifactorial, due to gait instability, age and medication side effect. Fortunately, patient has no bony injuries.  -Awaiting assisted living placement as above. 7. FTT: Patient has failure to thrive, and in the setting of advanced age and absence of acute pathology, I suspect this is due to dementia. At suggestion of care giver, Britta Mccreedy and Trecia Rogers. -Patient seen by palliative care and plan is to DC to assisted living facility with palliative care services upon discharge.    LOS: 5 days   Eston Heslin C 07/12/2011, 3:29 PM

## 2011-07-12 NOTE — Progress Notes (Signed)
Physical Therapy Treatment Patient Details Name: Russell Fisher MRN: [id number] DOB: [birth year] Today's Date: 07/12/2011 Time: 4098-1191 PT Time Calculation (min): 34 min  PT Assessment / Plan / Recommendation Comments on Treatment Session  Pt much improved activity tolerance from previous sessions.     Follow Up Recommendations  Home health PT;Supervision/Assistance - 24 hour    Equipment Recommendations  None recommended by PT    Frequency Min 3X/week   Plan Discharge plan remains appropriate;Frequency remains appropriate    Precautions / Restrictions Precautions Precautions: Fall Restrictions Weight Bearing Restrictions: No   Pertinent Vitals/Pain Pt c/o low back pain.  Unable to rate.  RN notified.      Mobility  Bed Mobility Bed Mobility: Supine to Sit;Sit to Supine Supine to Sit: 7: Independent;HOB flat Sit to Supine: 6: Modified independent (Device/Increase time);With rail;HOB flat Transfers Transfers: Sit to Stand;Stand to Sit Sit to Stand: 5: Supervision;From bed Stand to Sit: 5: Supervision;To chair/3-in-1 Details for Transfer Assistance: Repeated cues for hand placement. Pt likes to pull on the walker.   Ambulation/Gait Ambulation/Gait Assistance: 4: Min guard Ambulation Distance (Feet): 150 Feet Assistive device: Rolling walker Ambulation/Gait Assistance Details: Repeated verbal and tactile cues to extend trunk and maintain appropriate distance from walker.  Pt continued to push walker too far ahead resulting in flexed trunk posture.  Pt continues to c/o backpain.   Gait Pattern: Decreased stride length Stairs: No Wheelchair Mobility Wheelchair Mobility: No    Exercises     PT Goals Acute Rehab PT Goals PT Goal Formulation: With patient Time For Goal Achievement: 07/15/11 Potential to Achieve Goals: Good Pt will go Supine/Side to Sit: with modified independence PT Goal: Supine/Side to Sit - Progress: Met Pt will Sit at Edge of Bed: with modified  independence;1-2 min PT Goal: Sit at Edge Of Bed - Progress: Met Pt will go Sit to Supine/Side: with modified independence PT Goal: Sit to Supine/Side - Progress: Met Pt will go Sit to Stand: with modified independence;from elevated surface PT Goal: Sit to Stand - Progress: Progressing toward goal Pt will go Stand to Sit: with modified independence PT Goal: Stand to Sit - Progress: Progressing toward goal Pt will Transfer Bed to Chair/Chair to Bed: with modified independence PT Transfer Goal: Bed to Chair/Chair to Bed - Progress: Progressing toward goal Pt will Ambulate: >150 feet;with modified independence;with rolling walker PT Goal: Ambulate - Progress: Progressing toward goal Pt will Go Up / Down Stairs: 1-2 stairs;with supervision;with rail(s) PT Goal: Up/Down Stairs - Progress: Not met  Visit Information  Last PT Received On: 07/12/11 Assistance Needed: +1    Subjective Data  Subjective: My back is hurting.  Patient Stated Goal: To be able to get home.    Cognition  Overall Cognitive Status: Appears within functional limits for tasks assessed/performed Arousal/Alertness: Awake/alert Orientation Level: Disoriented to;Time Behavior During Session: WFL for tasks performed    Balance  Balance Balance Assessed: No  End of Session PT - End of Session Equipment Utilized During Treatment: Gait belt Activity Tolerance: Patient tolerated treatment well Patient left: in chair;with call bell/phone within reach;with chair alarm set Nurse Communication: Mobility status;Other (comment);Patient requests pain meds (Pt in chair with alarm set. )    Alferd Apa 07/12/2011, 11:44 AM Theron Arista L. Braydan Marriott DPT [phone]

## 2011-07-12 NOTE — Progress Notes (Signed)
Patient [name]      DOB: [birth year]      VWU:[id number]   Palliative Medicine Team at [facility] Progress Note    Subjective:" I am not hungry this morning." Patient lying in bed, looking sleepy   Filed Vitals:   07/12/11 0500  BP: 148/71  Pulse: 81  Temp: 98.1 F (36.7 C)  Resp: 18   Physical exam:  General: Asleep in bed, easily aroused HEENT: Anicteric, buccal mucosa moist  Chest: CTA bilaterally  CVS: RRR, S1/S2, no MGR  Abdomen:soft, non-tender, BS audible, focal tenderness L/S area upon palpation  Ext: warm to touch, no edema, no calf tenderness  Neuro:alert, oriented to place and person  Assessment and plan: Russell Fisher is an 78 yo WM admitted 4/26 fell at home initially noted to have AMS changes that have resolved since admission. AMS changes attributed to a combination of polypharmacy (several sedative medications), mild dehydration, and mild dementia.  1) Code Status: DNR/DNI 2) Symptom Control:     -having mild L/S back pain that has been chronic per patient, requested nurse to medicate with Tylenol, otherwise no other c/o's 3) Disposition: plan chosen by [facility] for patient discharge to [facility], recommended Palliative Care Services to follow at ALF     Information given to caregivers yesterday. Time In Time Out Total Time Spent with Patient Total Overall Time  8:15a 8:35a 20 min 20 min     Greater than 50%  of this time was spent counseling and coordinating care related to the above assessment and plan.  Darin Engels, Dyann Ruddle Palliative Medicine Team [place] Team Phone: [phone] Pager: [phone]

## 2011-07-12 NOTE — Progress Notes (Signed)
Clinical Social Worker continuing to follow for disposition planning. Clinical Social Worker spoke with [facility] who stated that facility planned to assess pt this morning, but pt caregivers cancelled because pt was not feeling well. [facility] confirmed that clinical information was received. Clinical Social Worker received phone call from RN that pt family needed assistance with paperwork for [facility]. Clinical Social Worker met with pt and pt caregivers at bedside and reviewed that paperwork which is meant for physician to fill out. Clinical Social Worker spoke with MD about paperwork and MD agreeable to review, but pt PCP may be most appropriate MD to fill out paperwork. Clinical Social Worker discussed with Oneita Hurt who confirmed paperwork can be completed by PCP. Clinical Social Worker attempted to meet with pt caregivers to discuss, but not present in room and Clinical Social Worker contacted pt caregivers and left message. [facility] plans to come assess patient tomorrow am. Clinical Social Worker to continue to follow and facilitate pt discharge needs when pt medically stable for discharge.  Jacklynn Lewis, MSW, LCSWA  Clinical Social Work [phone]

## 2011-07-13 DIAGNOSIS — R112 Nausea with vomiting, unspecified: Secondary | ICD-10-CM

## 2011-07-13 DIAGNOSIS — K5900 Constipation, unspecified: Secondary | ICD-10-CM

## 2011-07-13 DIAGNOSIS — I509 Heart failure, unspecified: Secondary | ICD-10-CM

## 2011-07-13 DIAGNOSIS — R4182 Altered mental status, unspecified: Secondary | ICD-10-CM

## 2011-07-13 LAB — GLUCOSE, CAPILLARY
Glucose-Capillary: 100 mg/dL — ABNORMAL HIGH (ref 70–99)
Glucose-Capillary: 100 mg/dL — ABNORMAL HIGH (ref 70–99)

## 2011-07-13 MED ORDER — TUBERCULIN PPD 5 UNIT/0.1ML ID SOLN
5.0000 [IU] | Freq: Once | INTRADERMAL | Status: AC
  Administered 2011-07-13: 5 [IU] via INTRADERMAL
  Filled 2011-07-13 (×3): qty 0.1

## 2011-07-13 MED ORDER — METOCLOPRAMIDE HCL 5 MG PO TABS
5.0000 mg | ORAL_TABLET | Freq: Three times a day (TID) | ORAL | Status: DC
  Administered 2011-07-14 – 2011-07-16 (×6): 5 mg via ORAL
  Filled 2011-07-13 (×10): qty 1

## 2011-07-13 MED ORDER — ENSURE COMPLETE PO LIQD
237.0000 mL | Freq: Three times a day (TID) | ORAL | Status: DC
  Administered 2011-07-14 (×3): 237 mL via ORAL
  Administered 2011-07-15: 14:00:00 via ORAL
  Administered 2011-07-15: 237 mL via ORAL

## 2011-07-13 NOTE — Progress Notes (Signed)
Patient [name]      DOB: 1920/03/23      VWU:[id number]   Palliative Medicine Team at [facility] Progress Note    Subjective: "Im' tired". Patient sleeping, aroused easily.     Filed Vitals:   07/13/11 0500  BP: 119/81  Pulse: 91  Temp: 98.4 F (36.9 C)  Resp: 18   Physical exam: General: Elderly, pleasant WM, resting in bed comfortably  HEENT: Anicteric, PEARL, buccal mucosa moist Chest: CTA bilaterally  CVS: RRR, S1/S2, no MGR  Abdomen:soft, non-tender, BS audible  Ext: warm to touch, no edema, no calf tenderness  Neuro:alert, oriented to place and person   Assessment and plan: Patient is an 78 yo WM admitted 07/07/11 s/p fall with AMS changes. Past medical history pertinent for mild dementia, and CHF (currently compensated). AMS changes this admission per medicine team were likely secondary to polypharmacy, mild dehydration, advanced age and mild dementia.   1) DNR/DNI: MOST form completed and placed on chart 2) Symptom Control: Patient comfortable at present, does have intermittent LBP that is chronic. Patient feels it's being aggravated by lying in        in bed to much 3) Disposition: Plan in progress for discharge to [facility], in next few days  Time In Time Out Total Time Spent with Patient Total Overall Time  9:00a 9:20a 20 min 20 min   Greater than 50%  of this time was spent counseling and coordinating care related to the above assessment and plan.  Darin Engels, Dyann Ruddle Palliative Medicine Team [place] Team Phone: [phone] Pager: [phone]

## 2011-07-13 NOTE — Progress Notes (Signed)
Physical Therapy Treatment Patient Details Name: KERT SHACKETT MRN: [id number] DOB: 1920-07-20 Today's Date: 07/13/2011 Time: 4098-1191 PT Time Calculation (min): 19 min  PT Assessment / Plan / Recommendation Comments on Treatment Session  Pt did not feel well upon standing sitting very abruptly on the side of the bed. Once back to bed pt began dry heaving so I sat him up and raised the HOB. RN present when I left adminstering zofran.     Follow Up Recommendations  Supervision/Assistance - 24 hour;Home health PT (per notes pt likely to ALF (still needs 24 hour assist))    Equipment Recommendations  Defer to next venue    Frequency Min 3X/week   Plan Discharge plan remains appropriate;Frequency remains appropriate    Precautions / Restrictions Precautions Precautions: Fall   Pertinent Vitals/Pain Pt nauseous this morning    Mobility  Bed Mobility Bed Mobility: Scooting to HOB Supine to Sit: 5: Supervision;HOB elevated (30) Sit to Supine: 4: Min assist Scooting to [facility]: 5: Supervision;With rail Details for Bed Mobility Assistance: pt very lethargic this morning, needing cues for safety; pt feeling sick sitting EOB so needing minA to elevate legs to bed; with verbal cues pt able to pull himself and bridge to the [facility] Transfers Transfers: Sit to Stand;Stand to Sit Sit to Stand: 4: Min assist Stand to Sit: 3: Mod assist Details for Transfer Assistance: pt stood with close gaurding and cues for safe technique; pt stood for almost 30-40 seconds before abruptly sitting EOB with modA to control his descent (pt with c/o not feeling well and requesting to lie down, I took BP once supine and it was 128/78) Ambulation/Gait Ambulation/Gait Assistance: Not tested (comment)    Exercises General Exercises - Lower Extremity Ankle Circles/Pumps: AROM;20 reps;Both;Supine Long Arc Quad: AROM;Both;10 reps;Seated Toe Raises: AROM;Both;10 reps;Seated Heel Raises: AROM;Both;10 reps;Seated   PT  Goals Acute Rehab PT Goals PT Goal: Supine/Side to Sit - Progress: Progressing toward goal PT Goal: Sit to Supine/Side - Progress: Progressing toward goal PT Goal: Sit to Stand - Progress: Progressing toward goal PT Goal: Stand to Sit - Progress: Progressing toward goal  Visit Information  Last PT Received On: 07/13/11 Assistance Needed: +1    Subjective Data  Subjective: I don't know where I am, why am I here? What do you want me to do?   Cognition  Overall Cognitive Status: Impaired Area of Impairment: Memory;Awareness of deficits Arousal/Alertness: Lethargic Orientation Level: Disoriented to;Place;Time;Situation Behavior During Session: Lethargic Memory Deficits: did not have a clue where he was today, even after I reminded him multiple times he kept repeating I don't know where I am and what I am doing here    Balance     End of Session PT - End of Session Equipment Utilized During Treatment: Gait belt Activity Tolerance: Treatment limited secondary to medical complications (Comment) (pt nauseous ) Patient left: in bed;with bed alarm set;with nursing in room;with call bell/phone within reach Nurse Communication: Mobility status;Other (comment) (pt's nausea)    WHITLOW,Reggie Bise HELEN 07/13/2011, 1:35 PM

## 2011-07-13 NOTE — Progress Notes (Signed)
Tuberculin PPD placed in right anterior forearm at 2223. Marked c pen as circle around site. Will need to be read after 48hrs, per order.  Daphene Calamity RN

## 2011-07-13 NOTE — Progress Notes (Signed)
Clinical Social Worker continuing to follow for disposition planning. Clinical Social Worker contacted [facility] this morning and was notified that facility was planning to come assess pt at 12:30 pm. Clinical Social Worker contacted facility this afternoon who reported that facility had not yet come to assess pt, but were going to come to assess pt before the end of the day. Clinical Social Worker notified [facility] that pt will be medically ready for discharge tomorrow. Clinical Social Worker received notification from pt PCP that they are unable to complete admission paperwork for ALF. Clinical Social Worker had PCP fax paperwork over and will assist with facilitating MD to complete paperwork tomorrow. Clinical Social Worker contacted attempted to meet with pt caregivers/friends in pt room, but pt caregivers/friends not present at bedside. Clinical Social Worker contacted pt caregivers/friends by telephone and left voice message. Clinical Social Worker received return phone call from Mrs. Moody. Clinical Social Worker provided support to Mrs. Moody as she discussed "frustration" with disposition process. Clinical Social Worker discussed with Mrs. Mitzi Hansen secondary options for pt if [facility] unable to accept pt. Mrs. Mitzi Hansen states that they do not want pt to go to rehab at SNF as they do not feel that pt will benefit from rehab because they feel he will not participate. Mrs. Mitzi Hansen states that if [facility] unable to accept then plan would be to take pt home with home health services and have assistance from private caretakers. Clinical Social Worker to follow up with [facility] in the morning and update pt caregivers/friends. Clinical Social Worker to facilitate pt discharge needs when pt medically stable for discharge.  Jacklynn Lewis, MSW, LCSWA  Clinical Social Work [phone]

## 2011-07-13 NOTE — Progress Notes (Signed)
Subjective: C/o nausea- no vomiting, per caregivers at bedside still po po intake. Objective: Vital signs in last 24 hours: Temp:  [97.5 F (36.4 C)-98.4 F (36.9 C)] 97.5 F (36.4 C) (05/02 1400) Pulse Rate:  [88-94] 94  (05/02 1400) Resp:  [18] 18  (05/02 1400) BP: (119-143)/(81-86) 143/86 mmHg (05/02 1400) SpO2:  [92 %-99 %] 92 % (05/02 1400) Weight change:  Last BM Date: 07/11/11  Intake/Output from previous day: 05/01 0701 - 05/02 0700 In: 23 [P.O.:20; I.V.:3] Out: 1650 [Urine:1650] Total I/O In: 3 [I.V.:3] Out: -    Physical Exam: General: In no apparent distress  HEENT:  No clinical pallor, no jaundice, no conjunctival injection or discharge. Hydration status is fair. NECK:  Supple, JVP not seen, no carotid bruits, no palpable lymphadenopathy, no palpable goiter. CHEST:  Clinically clear to auscultation, no wheezes, no crackles. HEART: Normal S1-S2, normal, regular, no murmurs. ABDOMEN:  Full, soft, non-tender, no palpable organomegaly, no palpable masses, normal bowel sounds. LOWER EXTREMITIES:  No pitting edema, palpable peripheral pulses. MUSCULOSKELETAL SYSTEM:  Generalized osteoarthritic changes, otherwise, normal. Has abrasion left elbow. CENTRAL NERVOUS SYSTEM:  No focal neurologic deficit on gross examination.  Lab Results: No results found for this basename: WBC:2,HGB:2,HCT:2,PLT:2 in the last 72 hours  Basename 07/12/11 0500 07/11/11 0540  NA 132* 131*  K 3.7 4.0  CL 95* 95*  CO2 24 22  GLUCOSE 98 81  BUN 13 11  CREATININE 0.70 0.62  CALCIUM 9.2 9.2   No results found for this or any previous visit (from the past 240 hour(s)).   Studies/Results: No results found.  Medications: Scheduled Meds:    . amLODipine  10 mg Oral q1800  . donepezil  10 mg Oral QHS  . enoxaparin  40 mg Subcutaneous QHS  . finasteride  5 mg Oral Daily  . mulitivitamin with minerals  1 tablet Oral Daily  . polyethylene glycol  17 g Oral BID  . senna  1 tablet Oral  QHS  . sodium chloride  10 mL Intravenous Q12H  . sodium chloride  3 mL Intravenous Q12H  . sodium phosphate  1 enema Rectal Once  . tuberculin  5 Units Intradermal Once   Continuous Infusions:   PRN Meds:.acetaminophen, acetaminophen, albuterol, alum & mag hydroxide-simeth, metoCLOPramide (REGLAN) injection, ondansetron (ZOFRAN) IV, ondansetron, sodium phosphate  Assessment/Plan:  Principal Problem:  *Altered mental status: -Etiology is likely multifactorial, secondary to polypharmacy, mild dehydration, advanced age and underlying dementia. Patient had no clinical evidence of infection on presentation, with normal wcc, urinalysis, CXR. He has remained afebrile.  Brain MRI of 07/07/11, shows atrophy and chronic microvascular ischemia. No acute intracranial abnormality. -continue holding sedating medications for now, . Patient lives alone, and appears unable to cope.  -Child psychotherapist assisting with placement of an assisted living facility. -updated caregivers at bedside Active Problems:  1. CHF (congestive heart failure): Patient has a known history of chronic systolic heart failure, and appears well compensated at this time.  2. DJD (degenerative joint disease): Not problematic. Tylenol when necessary  3. Vomiting: - no vomiting today, but nauseous - will resume scheduled reglan 5mg  and follow -Patient had intermittent episodes of vomiting -Abdominal X-Ray of 07/07/11, showed large stool volume throughout the colon, compatible with clinical constipation. This may be the culprit, and certainly exacerbated by narcotic medication. Started Reglan on 07/09/11, with good effect, and patient has had no further vomiting. Reglan it was changed to now prn only. -recheck abd films in am.  4.Constipation:  As above continue miralax/Senna. Opioids are on hold.  -Patient had no bowel movement since 07/07/11, despite laxatives, so on 07/09/11, he was placed on Dulcolax suppositories, which resulted in 2 large  bowel movement s in early AM today. Prn Fleets enema has not proven necessary. He began moving his bowels after he was started on bowel regimen.   5. Mild dehydration: Status post hydration with IV fluids.  6. Falls: This is recurrent, and is likely multifactorial, due to gait instability, age and medication side effect. Fortunately, patient has no bony injuries.  -Awaiting assisted living placement as above. 7. FTT: Patient has failure to thrive, and in the setting of advanced age and absence of acute pathology, I suspect this is due to dementia. At suggestion of care giver, Britta Mccreedy and Trecia Rogers. -Patient was seen by palliative care and plan is to DC to assisted living facility with palliative care services upon discharge. -updated caregivers at bedside, awaiting ALF to eval pt prior to decidin.   LOS: 6 days   Patric Buckhalter C 07/13/2011, 5:37 PM

## 2011-07-14 ENCOUNTER — Inpatient Hospital Stay (HOSPITAL_COMMUNITY): Payer: Medicare Other

## 2011-07-14 DIAGNOSIS — R42 Dizziness and giddiness: Secondary | ICD-10-CM

## 2011-07-14 DIAGNOSIS — R112 Nausea with vomiting, unspecified: Secondary | ICD-10-CM

## 2011-07-14 DIAGNOSIS — K5900 Constipation, unspecified: Secondary | ICD-10-CM

## 2011-07-14 DIAGNOSIS — R627 Adult failure to thrive: Secondary | ICD-10-CM

## 2011-07-14 DIAGNOSIS — R4182 Altered mental status, unspecified: Secondary | ICD-10-CM

## 2011-07-14 DIAGNOSIS — I509 Heart failure, unspecified: Secondary | ICD-10-CM

## 2011-07-14 LAB — GLUCOSE, CAPILLARY
Glucose-Capillary: 120 mg/dL — ABNORMAL HIGH (ref 70–99)
Glucose-Capillary: 133 mg/dL — ABNORMAL HIGH (ref 70–99)

## 2011-07-14 LAB — BASIC METABOLIC PANEL
CO2: 25 mEq/L (ref 19–32)
Calcium: 10 mg/dL (ref 8.4–10.5)
Creatinine, Ser: 0.78 mg/dL (ref 0.50–1.35)

## 2011-07-14 NOTE — Progress Notes (Signed)
Subjective:  Requesting ice cream and drinking ensure today, much more alert conversant.  Objective: Vital signs in last 24 hours: Temp:  [98.1 F (36.7 C)-98.6 F (37 C)] 98.3 F (36.8 C) (05/03 1410) Pulse Rate:  [79-93] 79  (05/03 1410) Resp:  [16-20] 20  (05/03 1410) BP: (124-134)/(76-85) 124/76 mmHg (05/03 1410) SpO2:  [92 %-96 %] 96 % (05/03 1410) Weight change:  Last BM Date: 07/12/11  Intake/Output from previous day: 05/02 0701 - 05/03 0700 In: 3 [I.V.:3] Out: 250 [Urine:250]     Physical Exam: General: In no apparent distress  HEENT:  No clinical pallor, no jaundice, no conjunctival injection or discharge. Hydration status is fair. NECK:  Supple, JVP not seen, no carotid bruits, no palpable lymphadenopathy, no palpable goiter. CHEST:  Clinically clear to auscultation, no wheezes, no crackles. HEART: Normal S1-S2, normal, regular, no murmurs. ABDOMEN:  Full, soft, non-tender, no palpable organomegaly, no palpable masses, normal bowel sounds. LOWER EXTREMITIES:  No pitting edema, palpable peripheral pulses. MUSCULOSKELETAL SYSTEM:  Generalized osteoarthritic changes, otherwise, normal. Has abrasion left elbow. CENTRAL NERVOUS SYSTEM:  No focal neurologic deficit on gross examination.  Lab Results: No results found for this basename: WBC:2,HGB:2,HCT:2,PLT:2 in the last 72 hours  Basename 07/14/11 0600 07/12/11 0500  NA 135 132*  K 3.6 3.7  CL 98 95*  CO2 25 24  GLUCOSE 109* 98  BUN 25* 13  CREATININE 0.78 0.70  CALCIUM 10.0 9.2   No results found for this or any previous visit (from the past 240 hour(s)).   Studies/Results: Dg Abd Portable 1v  07/14/2011  *RADIOLOGY REPORT*  Clinical Data: Follow up constipation  PORTABLE ABDOMEN - 1 VIEW  Comparison: 07/07/2011  Findings: There is moderate stool burden within the colon which appears improved from previous exam.  No dilated loops of small bowel or fluid levels identified.  There is a mild curvature of the  lumbar spine which is convex to the left.  There is an L1 compression deformity which appears unchanged from prior exam.  IMPRESSION:  1. Decrease in colonic stool burden.  Original Report Authenticated By: Rosealee Albee, M.D.    Medications: Scheduled Meds:    . amLODipine  10 mg Oral q1800  . donepezil  10 mg Oral QHS  . enoxaparin  40 mg Subcutaneous QHS  . feeding supplement  237 mL Oral TID BM  . finasteride  5 mg Oral Daily  . metoCLOPramide  5 mg Oral TID AC  . mulitivitamin with minerals  1 tablet Oral Daily  . polyethylene glycol  17 g Oral BID  . senna  1 tablet Oral QHS  . sodium chloride  10 mL Intravenous Q12H  . sodium chloride  3 mL Intravenous Q12H  . sodium phosphate  1 enema Rectal Once  . tuberculin  5 Units Intradermal Once   Continuous Infusions:   PRN Meds:.acetaminophen, acetaminophen, albuterol, alum & mag hydroxide-simeth, metoCLOPramide (REGLAN) injection, ondansetron (ZOFRAN) IV, ondansetron, sodium phosphate  Assessment/Plan:  Principal Problem:  *Altered mental status: -Etiology is likely multifactorial, secondary to polypharmacy, mild dehydration, advanced age and underlying dementia. Patient had no clinical evidence of infection on presentation, with normal wcc, urinalysis, CXR. He has remained afebrile.  Brain MRI of 07/07/11, shows atrophy and chronic microvascular ischemia. No acute intracranial abnormality. -continue holding sedating medications for now, . Patient lives alone, and appears unable to cope.  -Child psychotherapist assisting with placement of an assisted living facility. -updated caregivers at bedside Active Problems:  1.  CHF (congestive heart failure): Patient has a known history of chronic systolic heart failure, and appears well compensated at this time.  2. DJD (degenerative joint disease): Not problematic. Tylenol when necessary  3. Vomiting: - no vomiting today, nausea improved on reglan-continue -Patient had intermittent episodes  of vomiting -Abdominal X-Ray of 07/07/11, showed large stool volume throughout the colon, compatible with clinical constipation. This may be the culprit, and certainly exacerbated by narcotic medication. Started Reglan on 07/09/11, with good effect, and patient has had no further vomiting. Reglan it was changed to now prn only. -f/u abd film today 5/3 with decreased stool burden   4.Constipation:  As above continue miralax/Senna. Opioids are on hold.  -Patient had no bowel movement since 07/07/11, despite laxatives, so on 07/09/11, he was placed on Dulcolax suppositories, which resulted in 2 large bowel movement s in early AM today. Prn Fleets enema has not proven necessary. He began moving his bowels after he was started on bowel regimen. -f/u abd film with decreased stool burden   5. Mild dehydration: Status post hydration with IV fluids.  6. Falls: This is recurrent, and is likely multifactorial, due to gait instability, age and medication side effect. Fortunately, patient has no bony injuries.  7. FTT: Patient has failure to thrive, and in the setting of advanced age and absence of acute pathology, I suspect this is due to dementia. At suggestion of care giver, Britta Mccreedy and Trecia Rogers. -Patient was seen by palliative care and plan is to DC to assisted living facility with palliative care services upon discharge. -per [facility] ALF will take pt on monday  LOS: 7 days   Bransen Fassnacht C 07/14/2011, 8:30 PM

## 2011-07-14 NOTE — Progress Notes (Signed)
Clinical Social Worker received phone call from pt friend, Rose Phi at 8:25am and pt friend has not heard from [facility] about if they were able to assess pt on 07/13/2011. Mrs. Mitzi Hansen discussed that she is concerned about pt continued not eating and interested in discussing with Palliative Care Team if pt hospice eligible or residential hospice eligible. Clinical Social Worker discussed with Mrs. Mitzi Hansen that this Clinical Social Worker will contact Palliative team and ask them to see pt again to determine hospice eligibility and will contact [facility] to discuss if they were able to complete assessment. Clinical Social Worker notified Palliative Medicine Team and PMT RN planned to meet with pt this morning.  Clinical Child psychotherapist joined meeting with pt friends/POA and Rollen Sox, PMT NP to discuss pt eligibility for hospice. Per PMT, pt currently not appropriate for residential hospice placement, but would be appropriate to have palliative care follow at [facility] if facility accepts. At time of meeting, Clinical Social Worker had not yet been notified from [facility] their decision about pt. RN notified this Clinical Social Worker that pt had been more alert today and was asking to eat and asked that this message be relayed to [facility]. Clinical Social Worker contacted [facility] and spoke to facility [employer]. [facility] RN discussed that based on the assessment performed 07/13/11 facility felt pt was requiring a higher level of care than the facility could provide. Clinical Social Worker discussed with [facility] RN pt RN report that pt eating more and discussed that pt had been working with PT. [facility] stated that facility is willing to reassess pt and Clinical Social Worker asked facility to contact pt friends/POA to update as they were awaiting a response from the facility. Clinical Social Worker contacted Mrs. Moody after conversation with [facility] and Mrs. Mitzi Hansen  stated that she had received phone call from [facility] and planned to meet with the facility at 2:30pm to further discuss. Mrs. Mitzi Hansen states that if after meeting [facility] confirms they are unable to accept pt then the plan would be for pt to return home with home care or hospice. Mrs. Mitzi Hansen stated that she would contact this Clinical Social Worker once meeting complete.  Clinical Social Worker received phone call from [employer] with [employer] stating that [facility] plans to accept pt with Hospice services following at ALF. Clinical Social Worker provided needed information for hospice referral. Clinical Social Worker received phone call from Mrs. Mitzi Hansen stating that [facility] can accept pt on Monday as a new admission to ALF with Hospice following. Clinical Social Worker confirmed with [facility]. MD and RN notified. Clinical Social Worker to facilitate pt discharge needs to [facility] on Monday.  Jacklynn Lewis, MSW, LCSWA  Clinical Social Work [phone]

## 2011-07-14 NOTE — Progress Notes (Signed)
INITIAL ADULT NUTRITION ASSESSMENT Date: 07/14/2011   Time: 3:06 PM Reason for Assessment: MD Consult  ASSESSMENT: Male 78 y.o.  Dx: Altered mental status  Hx:  Past Medical History  Diagnosis Date  . DJD (degenerative joint disease)   . Anxiety   . Depression   . Insomnia   . Duodenal ulcer   . Bronchiectasis     ct chest 05/09/10  . Systolic heart failure     with ejection fracture of 30%  . Aortic regurgitation     mild to moderate  . Tricuspid regurgitation     mild to moderate  . CHF (congestive heart failure)    Related Meds:     . amLODipine  10 mg Oral q1800  . donepezil  10 mg Oral QHS  . enoxaparin  40 mg Subcutaneous QHS  . feeding supplement  237 mL Oral TID BM  . finasteride  5 mg Oral Daily  . metoCLOPramide  5 mg Oral TID AC  . mulitivitamin with minerals  1 tablet Oral Daily  . polyethylene glycol  17 g Oral BID  . senna  1 tablet Oral QHS  . sodium chloride  10 mL Intravenous Q12H  . sodium chloride  3 mL Intravenous Q12H  . sodium phosphate  1 enema Rectal Once  . tuberculin  5 Units Intradermal Once    Ht: 5' 8.5" (174 cm)  Wt: 162 lb (73.483 kg)  Ideal Wt: 71.3 kg % Ideal Wt: 103%  Usual Wt: unknown Wt Readings from Last 10 Encounters:  07/07/11 162 lb (73.483 kg)  05/17/11 151 lb 0.2 oz (68.5 kg)   % Usual Wt:   Body mass index is 24.27 kg/(m^2).  Food/Nutrition Related Hx:   Per MD note: Patient has failure to thrive, and in the setting of advanced age and absence of acute pathology, I suspect this is due to dementia. At suggestion of care giver, Britta Mccreedy and Trecia Rogers. -Patient was seen by palliative care and plan is to DC to assisted living facility with palliative care services upon discharge. Meal completion poor 0-50% Last bm 4/30 Pt states that he is not hungry he just wants ice cream and cold beverages.  Labs:  CMP     Component Value Date/Time   NA 135 07/14/2011 0600   K 3.6 07/14/2011 0600   CL 98 07/14/2011 0600   CO2  25 07/14/2011 0600   GLUCOSE 109* 07/14/2011 0600   BUN 25* 07/14/2011 0600   CREATININE 0.78 07/14/2011 0600   CALCIUM 10.0 07/14/2011 0600   PROT 7.2 07/07/2011 1140   ALBUMIN 3.5 07/07/2011 1140   AST 26 07/07/2011 1140   ALT 18 07/07/2011 1140   ALKPHOS 143* 07/07/2011 1140   BILITOT 1.1 07/07/2011 1140   GFRNONAA 78* 07/14/2011 0600   GFRAA >90 07/14/2011 0600   CBG (last 3)   Basename 07/14/11 1127 07/14/11 0658 07/14/11 0005  GLUCAP 127* 120* 133*   Lipid Panel     Component Value Date/Time   CHOL  Value: 121        ATP III CLASSIFICATION:  <200     mg/dL   Desirable  161-096  mg/dL   Borderline High  >=045    mg/dL   High 40/11/8117 1478   TRIG 57 02/12/2007 0700   HDL 44 02/12/2007 0700   CHOLHDL 2.8 02/12/2007 0700   VLDL 11 02/12/2007 0700   LDLCALC  Value: 66        Total Cholesterol/HDL:CHD Risk  Coronary Heart Disease Risk Table                     Men   Women  1/2 Average Risk   3.4   3.3 02/12/2007 0700    Intake/Output Summary (Last 24 hours) at 07/14/11 1508 Last data filed at 07/14/11 1300  Gross per 24 hour  Intake    180 ml  Output    250 ml  Net    -70 ml   Diet Order: Heart Healthy  Supplements/Tube Feeding:  IVF: NA  Estimated Nutritional Needs:   Kcal: 1700-1900 Protein: 75-85 grams Fluid: >1.8 L/day  NUTRITION DIAGNOSIS: -Inadequate oral intake (NI-2.1).  Status: Ongoing  RELATED TO: dementia  AS EVIDENCE BY: limited intake at meals  MONITORING/EVALUATION(Goals): Goal: Pt will consume >50% of his needs. Monitor: po intake, weight  EDUCATION NEEDS: -No education needs identified at this time  INTERVENTION:  Continue Ensure Complete BID  Magic cup tid  Recommend liberalize diet to Regular  Dietitian #:[id number]  DOCUMENTATION CODES Per approved criteria  -Not Applicable    Kendell Bane Cornelison 07/14/2011, 3:06 PM

## 2011-07-14 NOTE — Progress Notes (Addendum)
Patient WU:JWJXBJ KASHTEN GOWIN      DOB: January 02, 1921      YNW:[id number]   Palliative Medicine Team at [facility] Progress Note    Subjective:Patient sleepy, responding with head nods. Met with friends of the patient Mr and Mrs Mitzi Hansen, who have HPOA. [facility] informed them that patient is not eligible for admission due to increased skilled need. Caregivers do not want to consider a SNF at this time, and would be interested in in taking patient back to his home with private caregiver services in addition to  home care or hospice services if eligibility. Spoke with Jacklynn Lewis CSW and she stated she will continue to discuss other ALF options with caregivers.  Overall patient's does have a history of dementia with intermittent clarity and confusion. Oral food and fluid intake has been minimal during this hospitalization (0-20%) and per caregivers intake  has been declining. Current physical therapy evaluation feels  24 hour supervision/assistance is required since patient is a fall risk. Mr Edelson continues experiencing intermittent dizziness and nausea, was initially constipated upon admission this was resolved medical interventions.  Filed Vitals:   07/14/11 1000  BP: 134/85  Pulse: 86  Temp: 98.1 F (36.7 C)  Resp: 20   Physical exam:  General: Elderly WM, sleeping in NAD HEENT: Anicteric, PEARL, buccal mucosa dry  Chest: CTA bilaterally  CVS: RRR, S1/S2, no MGR  Abdomen:soft, non-tender, BS audible  Ext: warm to touch, no edema, no calf tenderness  Neuro:alert, oriented to place and person  Assessment and plan: Patient is an elderly male admitted 07/07/11, s/p fall at home, with AMS changes. AMS changes per admitting team likely secondary to polypharmacy, mild dehydration and mild dementia.   1) DNR/DNI: MOST/[place] form on chart 2) Symptom Control:      -Nausea: Ondansteron available as needed for nausea (usually daily), also started on metoclopramide scheduled three  times daily     -Decreased Nutritional Intake: Nutrition consult ordered     -Dizziness: unsure if this is related limited oral intake or physiologic issue, renal function WNL, glucose readings have been 100 or greater 3) Disposition: Clinical social worker Jacklynn Lewis to continue to work work with caregivers for placement to ALF with hospice services if eligible.  Time In Time Out Total Time Spent with Patient Total Overall Time     12 noon  12:45p    20 min    45 min   Greater than 50%  of this time was spent counseling and coordinating care related to the above assessment and plan.  Darin Engels, Dyann Ruddle Palliative Medicine Team [place] Team Phone: [phone] Pager: [phone]

## 2011-07-15 DIAGNOSIS — I509 Heart failure, unspecified: Secondary | ICD-10-CM

## 2011-07-15 DIAGNOSIS — R4182 Altered mental status, unspecified: Secondary | ICD-10-CM

## 2011-07-15 DIAGNOSIS — K5900 Constipation, unspecified: Secondary | ICD-10-CM

## 2011-07-15 DIAGNOSIS — R112 Nausea with vomiting, unspecified: Secondary | ICD-10-CM

## 2011-07-15 LAB — GLUCOSE, CAPILLARY
Glucose-Capillary: 132 mg/dL — ABNORMAL HIGH (ref 70–99)
Glucose-Capillary: 148 mg/dL — ABNORMAL HIGH (ref 70–99)
Glucose-Capillary: 135 mg/dL — ABNORMAL HIGH (ref 70–99)

## 2011-07-15 NOTE — Progress Notes (Signed)
Subjective:  Denies any new complaints, remained alert and conversant. Denies any further nausea or vomiting  Objective: Vital signs in last 24 hours: Temp:  [98.3 F (36.8 C)-98.6 F (37 C)] 98.6 F (37 C) (05/04 0600) Pulse Rate:  [79-93] 92  (05/04 0600) Resp:  [18-20] 18  (05/04 0600) BP: (124-143)/(76-83) 143/83 mmHg (05/04 0600) SpO2:  [96 %-98 %] 97 % (05/04 0600) Weight change:  Last BM Date: 07/14/11  Intake/Output from previous day: 05/03 0701 - 05/04 0700 In: 300 [P.O.:300] Out: 800 [Urine:800]     Physical Exam: General: In no apparent distress  CHEST:  Clinically clear to auscultation, no wheezes, no crackles. HEART: Normal S1-S2, normal, regular, no murmurs. ABDOMEN:  Full, soft, non-tender, no palpable organomegaly, no palpable masses, normal bowel sounds. LOWER EXTREMITIES:  No pitting edema, palpable peripheral pulses. MUSCULOSKELETAL SYSTEM:  Generalized osteoarthritic changes, otherwise, normal. Has abrasion left elbow. CENTRAL NERVOUS SYSTEM:  No focal neurologic deficit on gross examination.  Lab Results: No results found for this basename: WBC:2,HGB:2,HCT:2,PLT:2 in the last 72 hours  Basename 07/14/11 0600  NA 135  K 3.6  CL 98  CO2 25  GLUCOSE 109*  BUN 25*  CREATININE 0.78  CALCIUM 10.0   No results found for this or any previous visit (from the past 240 hour(s)).   Studies/Results: Dg Abd Portable 1v  07/14/2011  *RADIOLOGY REPORT*  Clinical Data: Follow up constipation  PORTABLE ABDOMEN - 1 VIEW  Comparison: 07/07/2011  Findings: There is moderate stool burden within the colon which appears improved from previous exam.  No dilated loops of small bowel or fluid levels identified.  There is a mild curvature of the lumbar spine which is convex to the left.  There is an L1 compression deformity which appears unchanged from prior exam.  IMPRESSION:  1. Decrease in colonic stool burden.  Original Report Authenticated By: Rosealee Albee, M.D.     Medications: Scheduled Meds:    . amLODipine  10 mg Oral q1800  . donepezil  10 mg Oral QHS  . enoxaparin  40 mg Subcutaneous QHS  . feeding supplement  237 mL Oral TID BM  . finasteride  5 mg Oral Daily  . metoCLOPramide  5 mg Oral TID AC  . mulitivitamin with minerals  1 tablet Oral Daily  . polyethylene glycol  17 g Oral BID  . senna  1 tablet Oral QHS  . sodium chloride  10 mL Intravenous Q12H  . sodium chloride  3 mL Intravenous Q12H  . sodium phosphate  1 enema Rectal Once   Continuous Infusions:   PRN Meds:.acetaminophen, acetaminophen, albuterol, alum & mag hydroxide-simeth, metoCLOPramide (REGLAN) injection, ondansetron (ZOFRAN) IV, ondansetron, sodium phosphate  Assessment/Plan:  Principal Problem:  *Altered mental status: -Etiology is likely multifactorial, secondary to polypharmacy, mild dehydration, advanced age and underlying dementia. Patient had no clinical evidence of infection on presentation, with normal wcc, urinalysis, CXR. He has remained afebrile.  Brain MRI of 07/07/11, shows atrophy and chronic microvascular ischemia. No acute intracranial abnormality. -continue holding sedating medications for now, . Patient lives alone, and appears unable to cope.  -Awaiting assisted living with hospice on Monday -Active Problems:  1. CHF (congestive heart failure): Patient has a known history of chronic systolic heart failure, and appears well compensated at this time.  2. DJD (degenerative joint disease): Not problematic. Tylenol when necessary  3. Vomiting: Resolved, follow. -Patient had intermittent episodes of vomiting -Abdominal X-Ray of 07/07/11, showed large stool volume throughout the  colon, compatible with clinical constipation. This may be the culprit, and certainly exacerbated by narcotic medication. Started Reglan on 07/09/11, with good effect, and patient has had no further vomiting. Reglan it was changed to now prn only. -f/u abd film today 5/3 with  decreased stool burden   4.Constipation:  As above continue miralax/Senna. Opioids are on hold.  -Patient had no bowel movement since 07/07/11, despite laxatives, so on 07/09/11, he was placed on Dulcolax suppositories, which resulted in 2 large bowel movement s in early AM today. Prn Fleets enema has not proven necessary. He began moving his bowels after he was started on bowel regimen. -f/u abd film with decreased stool burden   5. Mild dehydration: Status post hydration with IV fluids.  6. Falls: This is recurrent, and is likely multifactorial, due to gait instability, age and medication side effect. Fortunately, patient has no bony injuries.  7. FTT: Patient has failure to thrive, and in the setting of advanced age and absence of acute pathology, I suspect this is due to dementia. At suggestion of care giver, Britta Mccreedy and Trecia Rogers. -Patient was seen by palliative care and plan is to DC to assisted living facility with palliative care services upon discharge. -per [facility] will take pt on monday  LOS: 8 days   Saadiq Poche C 07/15/2011, 11:23 AM

## 2011-07-16 DIAGNOSIS — R112 Nausea with vomiting, unspecified: Secondary | ICD-10-CM

## 2011-07-16 DIAGNOSIS — K5900 Constipation, unspecified: Secondary | ICD-10-CM

## 2011-07-16 DIAGNOSIS — I509 Heart failure, unspecified: Secondary | ICD-10-CM

## 2011-07-16 DIAGNOSIS — R4182 Altered mental status, unspecified: Secondary | ICD-10-CM

## 2011-07-16 LAB — GLUCOSE, CAPILLARY

## 2011-07-16 MED ORDER — METOCLOPRAMIDE HCL 5 MG PO TABS
5.0000 mg | ORAL_TABLET | Freq: Three times a day (TID) | ORAL | Status: DC
  Administered 2011-07-16 – 2011-07-17 (×3): 5 mg via ORAL
  Filled 2011-07-16 (×8): qty 1

## 2011-07-16 MED ORDER — SODIUM CHLORIDE 0.9 % IV SOLN
INTRAVENOUS | Status: AC
  Administered 2011-07-16: 13:00:00 via INTRAVENOUS

## 2011-07-16 NOTE — Progress Notes (Signed)
Patient complaining of nausea. Not vomiting, but "dry heaving".  Gave 4mg  zofran IV.  Will continue to monitor.

## 2011-07-16 NOTE — Progress Notes (Signed)
Subjective:  nauseous overnight and was given Zofran, sleepy this a.m.  Objective: Vital signs in last 24 hours: Temp:  [96.6 F (35.9 Fisher)-98 F (36.7 Fisher)] 97.7 F (36.5 Fisher) (05/05 0223) Pulse Rate:  [84-92] 92  (05/05 0223) Resp:  [16-19] 16  (05/05 0223) BP: (127-135)/(80-87) 129/80 mmHg (05/05 0223) SpO2:  [93 %-95 %] 94 % (05/05 0223) Weight change:  Last BM Date: 07/14/11  Intake/Output from previous day: 05/04 0701 - 05/05 0700 In: -  Out: 350 [Urine:350]     Physical Exam: General: In no apparent distress  CHEST:  Clinically clear to auscultation, no wheezes, no crackles. HEART: Normal S1-S2, normal, regular, no murmurs. ABDOMEN:  Full, soft, non-tender, no palpable organomegaly, no palpable masses, normal bowel sounds. LOWER EXTREMITIES:  No pitting edema, palpable peripheral pulses. MUSCULOSKELETAL SYSTEM:  Generalized osteoarthritic changes, otherwise, normal. Has abrasion left elbow. CENTRAL NERVOUS SYSTEM:  No focal neurologic deficit on gross examination.  Lab Results: No results found for this basename: WBC:2,HGB:2,HCT:2,PLT:2 in the last 72 hours  Basename 07/14/11 0600  NA 135  K 3.6  CL 98  CO2 25  GLUCOSE 109*  BUN 25*  CREATININE 0.78  CALCIUM 10.0   No results found for this or any previous visit (from the past 240 hour(s)).   Studies/Results: No results found.  Medications: Scheduled Meds:    . amLODipine  10 mg Oral q1800  . donepezil  10 mg Oral QHS  . enoxaparin  40 mg Subcutaneous QHS  . feeding supplement  237 mL Oral TID BM  . finasteride  5 mg Oral Daily  . metoCLOPramide  5 mg Oral TID AC  . mulitivitamin with minerals  1 tablet Oral Daily  . polyethylene glycol  17 g Oral BID  . senna  1 tablet Oral QHS  . sodium chloride  10 mL Intravenous Q12H  . sodium chloride  3 mL Intravenous Q12H  . sodium phosphate  1 enema Rectal Once   Continuous Infusions:   PRN Meds:.acetaminophen, acetaminophen, albuterol, alum & mag  hydroxide-simeth, metoCLOPramide (REGLAN) injection, ondansetron (ZOFRAN) IV, ondansetron, sodium phosphate  Assessment/Plan:  Principal Problem:  *Altered mental status: -Etiology is likely multifactorial, secondary to polypharmacy, mild dehydration, advanced age and underlying dementia. Patient had no clinical evidence of infection on presentation, with normal wcc, urinalysis, CXR. He has remained afebrile.  Brain MRI of 07/07/11, shows atrophy and chronic microvascular ischemia. No acute intracranial abnormality. -continue holding sedating medications for now, . Patient lives alone, and appears unable to cope.  -Awaiting assisted living with hospice on Monday -Active Problems:  1. CHF (congestive heart failure): Patient has a known history of chronic systolic heart failure, and appears well compensated at this time.  2. DJD (degenerative joint disease): Not problematic. Tylenol when necessary  3. N/V: Will increase Reglan to a.Fisher. and at bedtime. -Patient had intermittent episodes of vomiting -Abdominal X-Ray of 07/07/11, showed large stool volume throughout the colon, compatible with clinical constipation. This may be the culprit, and certainly exacerbated by narcotic medication. Started Reglan on 07/09/11, with good effect, and patient has had no further vomiting. Reglan it was changed then to prn only. -f/u abd film today 5/3 with decreased stool burden   4.Constipation:  As above continue miralax/Senna. Opioids are on hold.  -Patient had no bowel movement since 07/07/11, despite laxatives, so on 07/09/11, he was placed on Dulcolax suppositories, which resulted in 2 large bowel movement s in early AM today. Prn Fleets enema has not proven  necessary. He began moving his bowels after he was started on bowel regimen. -f/u abd film with decreased stool burden   5. Mild dehydration: Status post hydration with IV fluids.  6. Falls: This is recurrent, and is likely multifactorial, due to gait  instability, age and medication side effect. Fortunately, patient has no bony injuries.  7. FTT: Patient has failure to thrive, and in the setting of advanced age and absence of acute pathology, I suspect this is due to dementia. At suggestion of care giver, Britta Mccreedy and Trecia Rogers. -Patient was seen by palliative care and plan is to DC to assisted living facility with palliative care services upon discharge. -per [facility] will take pt on monday  LOS: 9 days   Russell Fisher 07/16/2011, 10:21 AM

## 2011-07-16 NOTE — Progress Notes (Signed)
PPD placed on 5/2 at 2220 on right anterior FA.  Results read on 5/4 at 2230 were negative.

## 2011-07-17 DIAGNOSIS — R112 Nausea with vomiting, unspecified: Secondary | ICD-10-CM

## 2011-07-17 DIAGNOSIS — K5900 Constipation, unspecified: Secondary | ICD-10-CM

## 2011-07-17 DIAGNOSIS — R4182 Altered mental status, unspecified: Secondary | ICD-10-CM

## 2011-07-17 DIAGNOSIS — I509 Heart failure, unspecified: Secondary | ICD-10-CM

## 2011-07-17 LAB — GLUCOSE, CAPILLARY
Glucose-Capillary: 110 mg/dL — ABNORMAL HIGH (ref 70–99)
Glucose-Capillary: 111 mg/dL — ABNORMAL HIGH (ref 70–99)
Glucose-Capillary: 91 mg/dL (ref 70–99)

## 2011-07-17 LAB — BASIC METABOLIC PANEL
CO2: 24 mEq/L (ref 19–32)
GFR calc non Af Amer: 79 mL/min — ABNORMAL LOW (ref >90)
Glucose, Bld: 102 mg/dL — ABNORMAL HIGH (ref 70–99)
Potassium: 4 mEq/L (ref 3.5–5.1)
Sodium: 135 mEq/L (ref 135–145)

## 2011-07-17 MED ORDER — ENSURE COMPLETE PO LIQD: 237.0000 mL | Freq: Three times a day (TID) | ORAL | Status: DC

## 2011-07-17 MED ORDER — AMLODIPINE BESYLATE 10 MG PO TABS: 10.0000 mg | ORAL_TABLET | Freq: Every day | ORAL | Status: DC

## 2011-07-17 MED ORDER — TRAMADOL HCL 50 MG PO TABS: 50.0000 mg | ORAL_TABLET | Freq: Four times a day (QID) | ORAL | Status: AC | PRN

## 2011-07-17 MED ORDER — POLYETHYLENE GLYCOL 3350 17 G PO PACK: 17.0000 g | PACK | Freq: Every day | ORAL | Status: AC

## 2011-07-17 MED ORDER — ONDANSETRON HCL 4 MG PO TABS: 4.0000 mg | ORAL_TABLET | Freq: Four times a day (QID) | ORAL | Status: AC | PRN

## 2011-07-17 MED ORDER — SENNA 8.6 MG PO TABS: 1.0000 | ORAL_TABLET | Freq: Every day | ORAL | Status: DC

## 2011-07-17 MED ORDER — METOCLOPRAMIDE HCL 5 MG PO TABS: 5.0000 mg | ORAL_TABLET | Freq: Three times a day (TID) | ORAL | Status: DC

## 2011-07-17 MED ORDER — ALUM & MAG HYDROXIDE-SIMETH 200-200-20 MG/5ML PO SUSP: 30.0000 mL | Freq: Four times a day (QID) | ORAL | Status: AC | PRN

## 2011-07-17 MED ORDER — ACETAMINOPHEN 325 MG PO TABS: 650.0000 mg | ORAL_TABLET | Freq: Four times a day (QID) | ORAL | Status: AC | PRN

## 2011-07-17 NOTE — Progress Notes (Signed)
Clinical Social Worker facilitated pt discharge needs including contacting facility, pt friends/POA, and arranging ambulance transportation for pt to [facility]. No further social work needs identified at this time. Clinical Social Worker signing off.  Jacklynn Lewis, MSW, LCSWA Clinical Social Work [phone]

## 2011-07-17 NOTE — Discharge Summary (Signed)
Discharge Note  Name: Russell Fisher MRN: [id number] DOB: 02/25/1921 78 y.o.  Date of Admission: 07/07/2011 10:43 AM Date of Discharge: 07/17/2011 Attending Physician: Kela Millin, MD  Discharge Diagnosis: Principal Problem:  *Altered mental status Active Problems:  CHF (congestive heart failure)  DJD (degenerative joint disease)  Vomiting  failure to thrive, adult  Discharge Medications: Medication List  As of 07/17/2011 12:22 PM   STOP taking these medications         ALPRAZolam 0.5 MG tablet      amitriptyline 100 MG tablet      clonazepam 0.125 MG disintegrating tablet      ibuprofen 200 MG tablet      ICAPS PO      OCUVITE PO      sulfamethoxazole-trimethoprim 800-160 MG per tablet         TAKE these medications         acetaminophen 325 MG tablet   Commonly known as: TYLENOL   Take 2 tablets (650 mg total) by mouth every 6 (six) hours as needed (or Fever >/= 101).      alum & mag hydroxide-simeth 200-200-20 MG/5ML suspension   Commonly known as: MAALOX/MYLANTA   Take 30 mLs by mouth every 6 (six) hours as needed (dyspepsia).      amLODipine 10 MG tablet   Commonly known as: NORVASC   Take 1 tablet (10 mg total) by mouth daily at 6 PM.      donepezil 10 MG tablet   Commonly known as: ARICEPT   Take 10 mg by mouth at bedtime.      feeding supplement Liqd   Take 237 mLs by mouth 3 (three) times daily between meals.      metoCLOPramide 5 MG tablet   Commonly known as: REGLAN   Take 1 tablet (5 mg total) by mouth 4 (four) times daily -  before meals and at bedtime.      multivitamin capsule   Once a day      ondansetron 4 MG tablet   Commonly known as: ZOFRAN   Take 1 tablet (4 mg total) by mouth every 6 (six) hours as needed for nausea.      polyethylene glycol packet   Commonly known as: MIRALAX / GLYCOLAX   Take 17 g by mouth daily.      PROSCAR 5 MG tablet   Generic drug: finasteride   Once a day      senna 8.6 MG Tabs   Commonly  known as: SENOKOT   Take 1 tablet (8.6 mg total) by mouth at bedtime.      traMADol 50 MG tablet   Commonly known as: ULTRAM   Take 1 tablet (50 mg total) by mouth every 6 (six) hours as needed for pain.            Disposition and follow-up:   Mr.Russell Fisher was discharged from [facility] in stable condition.Poor prognosis>To ALF with HOSPICE     Follow-up Appointments: Discharge Orders    Future Orders Please Complete By Expires   DME Hospital bed      For home use only DME Wheelchair manual      Diet general      Increase activity slowly         Consultations: Treatment Team:  Palliative [facility]  Procedures Performed:  Ct Head Wo Contrast  07/07/2011  *RADIOLOGY REPORT*  Clinical Data:  Found on floor.  Generalized weakness.  CT  HEAD WITHOUT CONTRAST CT CERVICAL SPINE WITHOUT CONTRAST  Technique:  Multidetector CT imaging of the head and cervical spine was performed following the standard protocol without intravenous contrast.  Multiplanar CT image reconstructions of the cervical spine were also generated.  Comparison:  05/15/2011.  CT HEAD  Findings: No skull fracture or intracranial hemorrhage.  Partial opacification right mastoid air cells unchanged.  Global atrophy without hydrocephalus.  Small vessel disease type changes without CT evidence of large acute infarct.  Vascular calcifications.  No intracranial mass lesion detected on this unenhanced exam.  IMPRESSION: No skull fracture or intracranial hemorrhage.  Please see above.  CT CERVICAL SPINE  Findings: No cervical spine fracture.  Cervical spondylotic changes with spinal stenosis and cord flattening C4-5 through C6-7.  Cervical kyphosis.  No abnormal prevertebral soft tissue swelling.  IMPRESSION: No cervical spine fracture.  Please see above.  Original Report Authenticated By: Fuller Canada, M.D.   Ct Cervical Spine Wo Contrast  07/07/2011  *RADIOLOGY REPORT*  Clinical Data:  Found on floor.   Generalized weakness.  CT HEAD WITHOUT CONTRAST CT CERVICAL SPINE WITHOUT CONTRAST  Technique:  Multidetector CT imaging of the head and cervical spine was performed following the standard protocol without intravenous contrast.  Multiplanar CT image reconstructions of the cervical spine were also generated.  Comparison:  05/15/2011.  CT HEAD  Findings: No skull fracture or intracranial hemorrhage.  Partial opacification right mastoid air cells unchanged.  Global atrophy without hydrocephalus.  Small vessel disease type changes without CT evidence of large acute infarct.  Vascular calcifications.  No intracranial mass lesion detected on this unenhanced exam.  IMPRESSION: No skull fracture or intracranial hemorrhage.  Please see above.  CT CERVICAL SPINE  Findings: No cervical spine fracture.  Cervical spondylotic changes with spinal stenosis and cord flattening C4-5 through C6-7.  Cervical kyphosis.  No abnormal prevertebral soft tissue swelling.  IMPRESSION: No cervical spine fracture.  Please see above.  Original Report Authenticated By: Fuller Canada, M.D.   Mr Brain Wo Contrast  07/07/2011  *RADIOLOGY REPORT*  Clinical Data: Increasing Falls.  Altered mental status.  MRI HEAD WITHOUT CONTRAST  Technique:  Multiplanar, multiecho pulse sequences of the brain and surrounding structures were obtained according to standard protocol without intravenous contrast.  Comparison: CT head 07/07/2011  Findings: Moderate atrophy.  Mild to moderate chronic microvascular ischemia in the white matter.  Brainstem is intact.  Negative for acute infarct.  Negative for hemorrhage or mass.  No subdural hemorrhage or fluid collection is identified.  Right mastoid sinus effusion.  Left mastoid sinuses clear. Paranasal sinuses are clear.  IMPRESSION: Atrophy and chronic microvascular ischemia.  No acute intracranial abnormality.  Original Report Authenticated By: Camelia Phenes, M.D.   Dg Abd Acute W/chest  07/07/2011  *RADIOLOGY  REPORT*  Clinical Data: Abdominal pain with vomiting.  ACUTE ABDOMEN SERIES (ABDOMEN 2 VIEW & CHEST 1 VIEW)  Comparison: 07/01/2011  Findings: Hyperexpansion is consistent with emphysema. Interstitial markings are diffusely coarsened with chronic features. The cardiopericardial silhouette is enlarged.  Prominence of the transverse aorta raises the question of aneurysm.  Right side up decubitus film shows no evidence for intraperitoneal free air.  Supine film shows no gaseous small bowel dilatation to suggest obstruction.  The patient does have a large volume of stool scattered along the entire length of the colon, as previously. Diffuse degenerative changes are noted the lumbar spine.  IMPRESSION: Emphysema without acute cardiopulmonary findings.  No intraperitoneal free air.  Large  stool volume throughout the colon.  Imaging features are compatible with clinical constipation.  Original Report Authenticated By: ERIC A. MANSELL, M.D.   Dg Abd Acute W/chest  07/01/2011  *RADIOLOGY REPORT*  Clinical Data: Shortness of breath, abdominal pain, fall  ACUTE ABDOMEN SERIES (ABDOMEN 2 VIEW & CHEST 1 VIEW)  Comparison: 05/17/2010  Findings: Increased interstitial markings.  Right basilar scarring versus atelectasis. No pleural effusion or pneumothorax.  Stable mild cardiomegaly.  Suspected left lateral 7-9th rib fractures.  Nonspecific bowel gas pattern, without disproportionate dilatation of the small bowel to suggest small bowel obstruction.  Prominent debris/stool-filled structure in the central abdomen may reflect stool within sigmoid colon, but is incompletely characterized.  Large amount of stool throughout the colon.  Suspected moderate compression deformity at L1, new.  Mild irregularity of the right inferior pubic ramus, although this may be secondary to projection.  IMPRESSION: No evidence of acute cardiopulmonary disease.  Stable mild cardiomegaly.  Large amount of stool throughout the colon.  Prominent  debris/stool- filled structure in the central abdomen may reflect stool within sigmoid colon, but is incompletely characterized.  Suspected left lateral 7-9th rib fractures.  Suspected moderate compression deformity at L1, new.  Mild irregularity of the right inferior pubic ramus, equivocal.  Original Report Authenticated By: Charline Bills, M.D.   Dg Abd Portable 1v  07/14/2011  *RADIOLOGY REPORT*  Clinical Data: Follow up constipation  PORTABLE ABDOMEN - 1 VIEW  Comparison: 07/07/2011  Findings: There is moderate stool burden within the colon which appears improved from previous exam.  No dilated loops of small bowel or fluid levels identified.  There is a mild curvature of the lumbar spine which is convex to the left.  There is an L1 compression deformity which appears unchanged from prior exam.  IMPRESSION:  1. Decrease in colonic stool burden.  Original Report Authenticated By: Rosealee Albee, M.D.     Admission HPI Patient is a 78 year old male with a past medical history of Dementia, DJD, Chronic Systolic Heart Failure, Depression/Insomnia-who has had recent admissions for falls-has refused SNF placement in the past, was brought to the hospital for the above noted complaints. Patient is a poor historian, however although slightly confused during my evaluation, he is following all commands, and is answering almost all my questions appropriately-later collaborated by Mr Moody-patient's POA. Patient claims that he has had intermittent vomiting and abdominal pain for the past few weeks, he cannot elaborate further. He claims that he has "fell", not sure whether he actually passed out "quite a few times" over the past few days as well.He denies any cough or fever,denies any headache or neck pain. He denies diarrhea. Currently denies abd pain-claims that "belly is sore". In any event, his POA-Mr Mitzi Hansen has been trying to place the patient to a ALF, he found him this am on the floor and confused and was  brought to the hospital for further evaluation and treatment.  Physical Exam:  General: In no apparent distress  CHEST: Clinically clear to auscultation, no wheezes, no crackles.  HEART: Normal S1-S2, normal, regular, no murmurs.  ABDOMEN: Full, soft, non-tender, no palpable organomegaly, no palpable masses, normal bowel sounds.  LOWER EXTREMITIES: No pitting edema, palpable peripheral pulses.  MUSCULOSKELETAL SYSTEM: Generalized osteoarthritic changes, otherwise, normal. Has abrasion left elbow.  CENTRAL NERVOUS SYSTEM: No focal neurologic deficit on gross examination.  Hospital Course by problem list: Principal Problem:  *Altered mental status Active Problems:  CHF (congestive heart failure)  DJD (degenerative joint  disease)  Vomiting  failure to thrive, adult Principal Problem:  *Altered mental status:  -Etiology is likely multifactorial, secondary to polypharmacy, mild dehydration, advanced age and underlying dementia.  Patient had no clinical evidence of infection on presentation, with normal wbc, urinalysis, CXR. He has remained afebrile in the hospital. Brain MRI of 07/07/11, showed atrophy and chronic microvascular ischemia. No acute intracranial abnormality.  All his sedating medications were held on medications, and have been dc'ed upon discharge except for them Ultram which he is to resume as his intermittently had complaints of back pain. It was noted that patient lives alone and she was evaluated by physical therapy and placed in an assisted living facility. Patient's by mouth intake has continued to be poor/minimal in the hospital. and caregivers. Because of the concern about his poor by mouth intake as well as overall failure to thrive on his caregivers are requested palliative care consult which was done on 07/11/2011. His wishes/healthcare surrogates wishes were for patient to be DNR/DNI, and are for palliative care to follow up outpatient at the assisted living facility.    -Active Problems:  1. CHF (congestive heart failure): Patient has a known history of chronic systolic heart failure, remained well compensated during his hospitalization.  2. DJD (degenerative joint disease): Patient was treated with Tylenol when necessary in the hospital, Ultram to be resumed  for severe pain and not relieved up by Tylenol, upon discharge. 3. N/V:  -Patient had intermittent episodes of vomiting -Abdominal X-Ray of 07/07/11, showed large stool volume throughout the colon, compatible with clinical constipation. The impression was that this was the likely etiology of his nausea/vomiting, and certainly exacerbated by narcotic medication. He was Started Reglan on 07/09/11, with good effect, after it was changed to when necessary only patient again began having intermittent nausea vomiting and so scheduled dosing was restarted and patient is to continue this upon discharge. He had a followup abd film on 5/3 which showed decreased stool burden  4.Constipation:  -Patient had no bowel movement since 07/07/11, despite laxatives, so on 07/09/11, he was placed on Dulcolax suppositories, which resulted in 2 large bowel movement s in early AM today. Prn Fleets enema was not proven necessary. He began moving his bowels after he was started on bowel regimen.  Followup abd film was discussed above revealed  decreased stool burden. His to continue the bowel regimen upon discharge.  5. Mild dehydration: Resolved, he was gently hydrated with IV fluids while in the hospital. Unfortunately he is prone to having this reoccurred since and is by mouth intake is poor. he was started on Ensure 3 times a day which he as been sipping on and is to continue this upon discharge, and has been noted to like eating  ice cream. 6. Falls: This is recurrent, and is likely multifactorial, due to gait instability, age and medication side effect-as discussed above. Fortunately, patient has no bony injuries. His been discharged from  an assisted living facility at this time, with hospice to follow. 7. FTT: Patient has failure to thrive, and in the setting of advanced age and absence of acute pathology, and the impression was that  this is due to dementia. At suggestion of care giver, Britta Mccreedy and Trecia Rogers.  -Patient was seen by palliative care as already discussed above, and will be DC'ed to assisted living facility with palliative care services.      Discharge Vitals:  BP 134/79  Pulse 64  Temp(Src) 98 F (36.7 C) (Oral)  Resp  16  Ht 5' 8.5" (1.74 m)  Wt 73.483 kg (162 lb)  BMI 24.27 kg/m2  SpO2 93%  Discharge Labs:  Results for orders placed during the hospital encounter of 07/07/11 (from the past 24 hour(s))  GLUCOSE, CAPILLARY     Status: Abnormal   Collection Time   07/16/11 12:59 PM      Component Value Range   Glucose-Capillary 116 (*) 70 - 99 (mg/dL)  GLUCOSE, CAPILLARY     Status: Abnormal   Collection Time   07/16/11  6:50 PM      Component Value Range   Glucose-Capillary 106 (*) 70 - 99 (mg/dL)  GLUCOSE, CAPILLARY     Status: Abnormal   Collection Time   07/17/11 12:09 AM      Component Value Range   Glucose-Capillary 110 (*) 70 - 99 (mg/dL)  BASIC METABOLIC PANEL     Status: Abnormal   Collection Time   07/17/11  5:10 AM      Component Value Range   Sodium 135  135 - 145 (mEq/L)   Potassium 4.0  3.5 - 5.1 (mEq/L)   Chloride 99  96 - 112 (mEq/L)   CO2 24  19 - 32 (mEq/L)   Glucose, Bld 102 (*) 70 - 99 (mg/dL)   BUN 26 (*) 6 - 23 (mg/dL)   Creatinine, Ser 4.78  0.50 - 1.35 (mg/dL)   Calcium 9.8  8.4 - 29.5 (mg/dL)   GFR calc non Af Amer 79 (*) >90 (mL/min)   GFR calc Af Amer >90  >90 (mL/min)  GLUCOSE, CAPILLARY     Status: Normal   Collection Time   07/17/11  6:23 AM      Component Value Range   Glucose-Capillary 91  70 - 99 (mg/dL)    Signed: Kela Millin 07/17/2011, 12:22 PM

## 2011-07-17 NOTE — Progress Notes (Signed)
PT Cancellation Note  Treatment cancelled today due to medical issues with patient which prohibited therapy. Pt sleeping and lethargic. Pt difficult to arouse. Patient declined OOB and ambulation. Caregivers present.    07/17/2011 Fredrich Birks PTA [phone] pager [phone] office    Fredrich Birks 07/17/2011, 11:07 AM

## 2011-07-18 NOTE — Care Management Note (Signed)
    Page 1 of 2   07/18/2011     3:16:50 PM   CARE MANAGEMENT NOTE 07/18/2011  Patient:  JOSHUWA, VECCHIO   Account Number:  [id number]  Date Initiated:  07/10/2011  Documentation initiated by:  Fransico Michael  Subjective/Objective Assessment:   admitted on 07/07/11 with c/o altered mental status     Action/Plan:   CT of head  ABD films   Anticipated DC Date:  07/17/2011   Anticipated DC Plan:  ASSISTED LIVING / REST HOME  In-house referral  Clinical Social Worker      DC Planning Services  CM consult      Choice offered to / List presented to:             Status of service:  Completed, signed off Medicare Important Message given?   (If response is "NO", the following Medicare IM given date fields will be blank) Date Medicare IM given:   Date Additional Medicare IM given:    Discharge Disposition:  ASSISTED LIVING  Per UR Regulation:  Reviewed for med. necessity/level of care/duration of stay  If discussed at Long Length of Stay Meetings, dates discussed:    Comments:  PCP: Lupita Raider  ContactTrecia Rogers Prudencio Burly [phone]  07/18/11 Patient discharged on 07/17/11 to [facility] with [facility] hospice following patient. Jacquelynn Cree RN, BSN, [place]  07/10/11-1207-J.Lutricia Horsfall  [phone]      78yo male patient admitted on 07/07/11 with altered mental status after a fall. Prior to admission, patient lived at home alone. Independent with ADLs. PT/OT evaluation recommended home health PT with 24 hours supervision.Marland Kitchen Possible SNF placement for rehab due to lack of support systems at home. CM will continue to monitor for discharge/home needs.

## 2013-05-03 IMAGING — CR DG RIBS W/ CHEST 3+V*L*
4 series · 4 of 4 positions shown · non-contrast
Comparison: Chest radiograph performed 03/25/2007, and CT of the
chest performed 05/09/2010

CLINICAL DATA: Status post fall; left lower anterior rib pain.

LEFT RIBS AND CHEST - 3+ VIEW

[t chest supine]
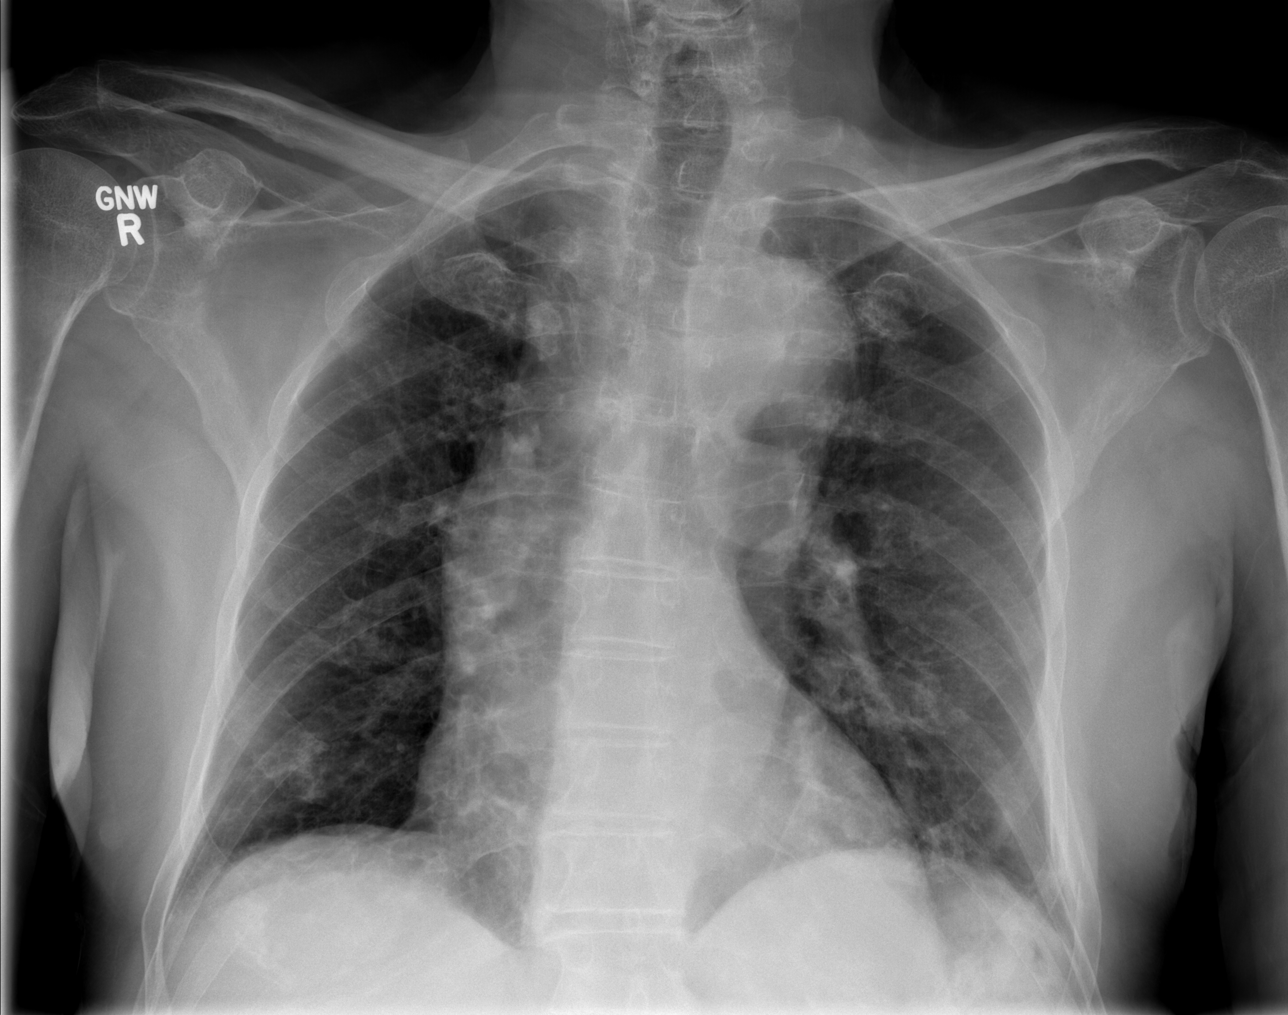

[t ribs ap upper left]
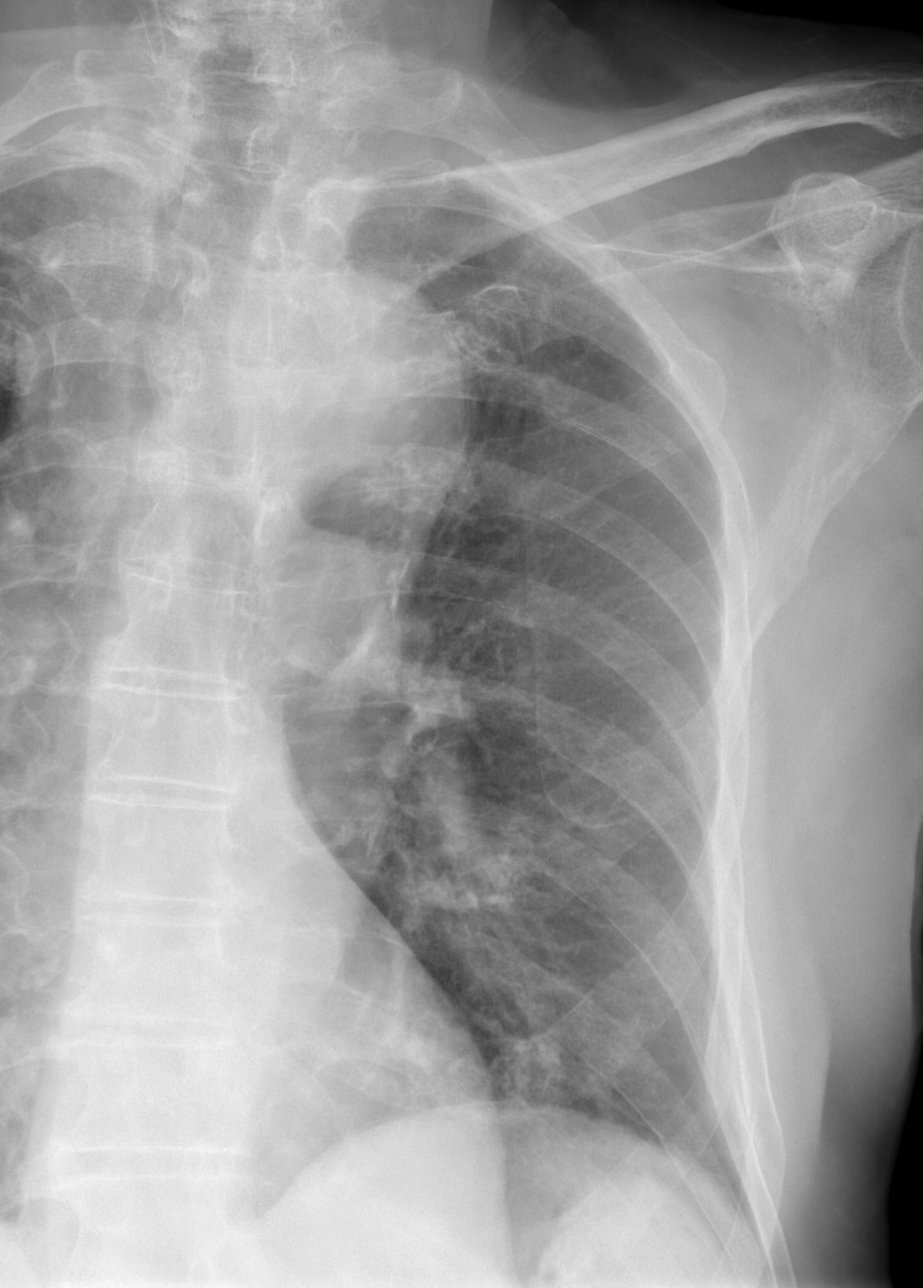

[t ribs ap lower left]
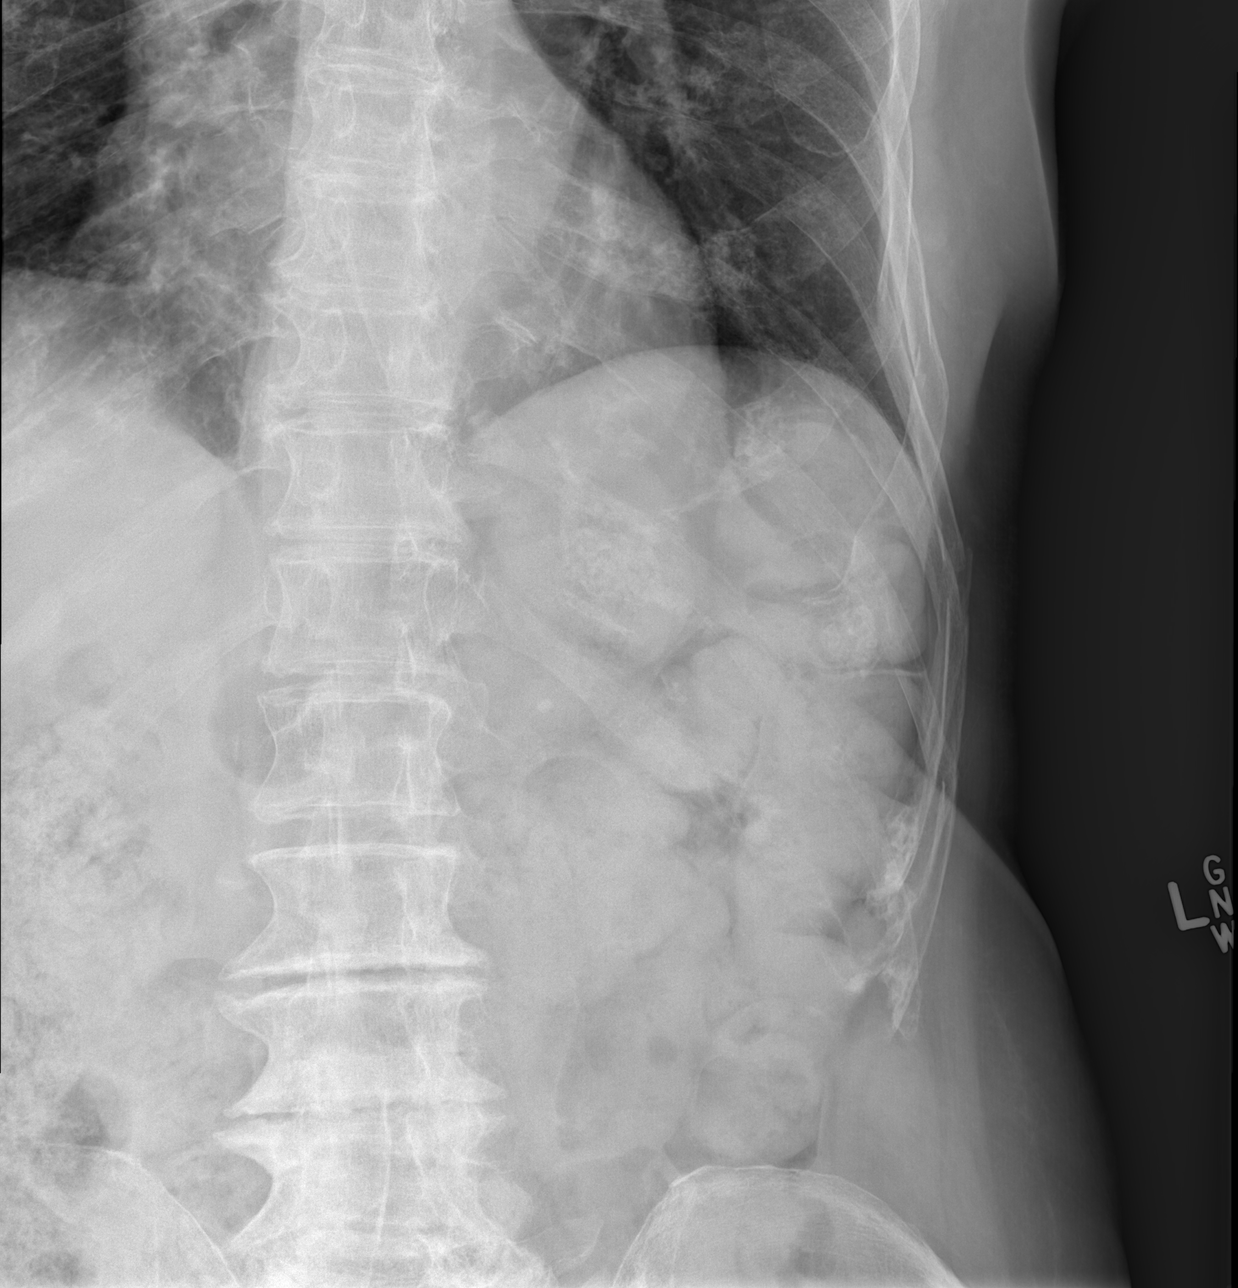

[t ribs lpo left]
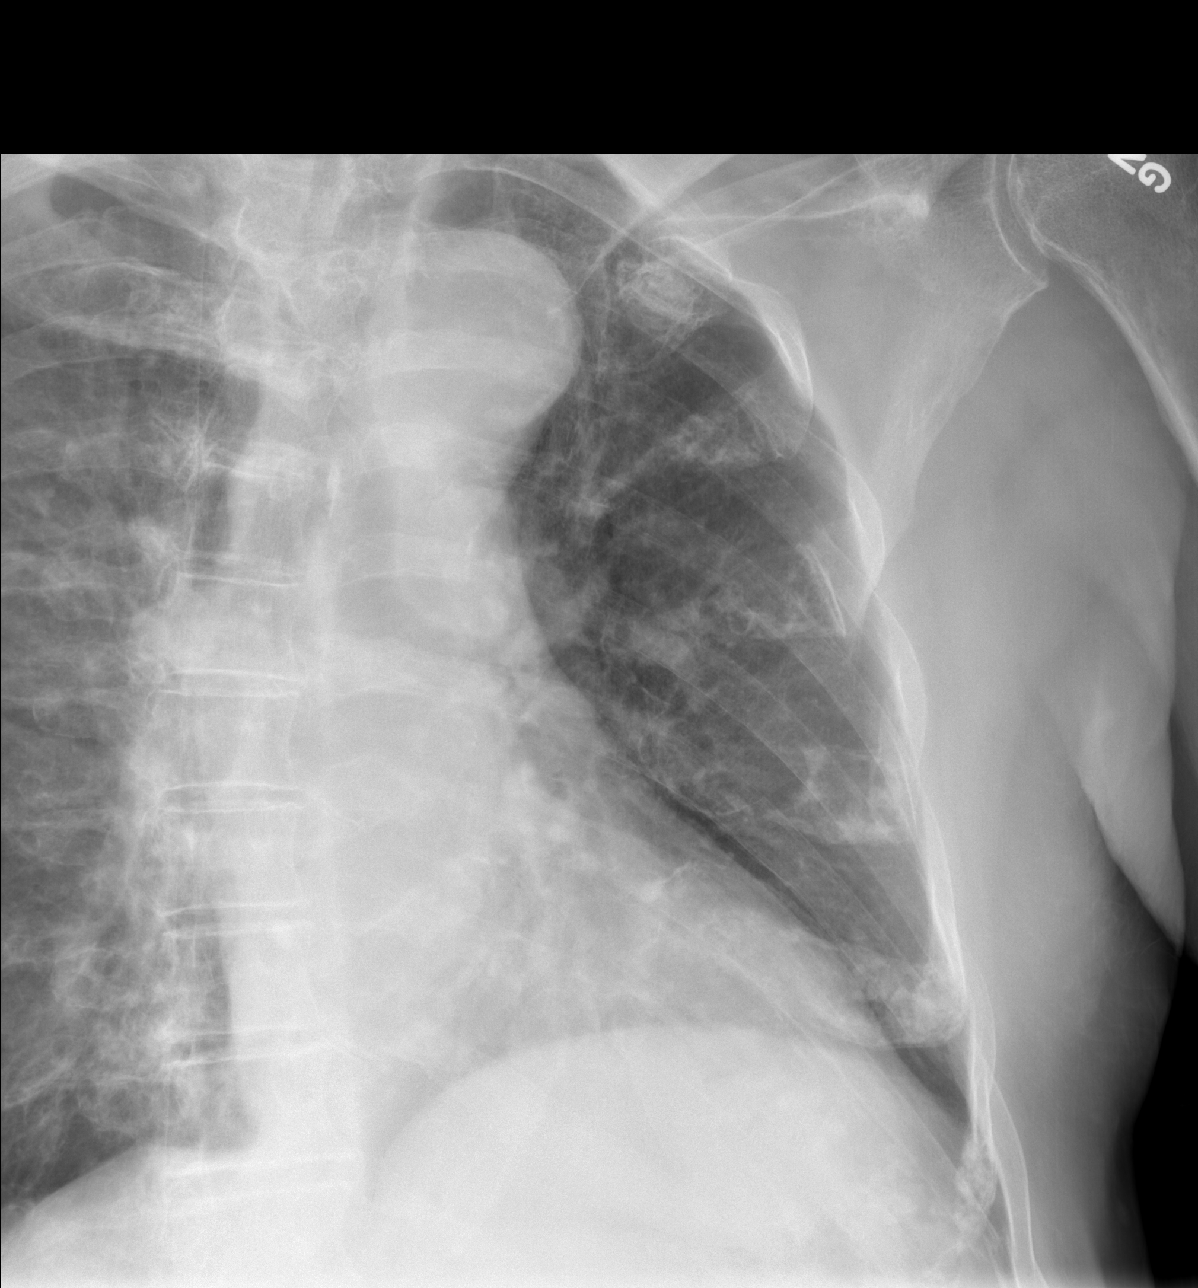

[4 of 4 positions shown; findings below may reference images not displayed]

FINDINGS: There are minimally displaced fractures of the left 5th
through 10th lateral ribs.

The lungs are well-aerated.  Known right-sided bronchiectasis is
better characterized on prior CT; evaluation is suboptimal due to
calcification of the costochondral junctions.  There is no evidence
of pleural effusion or pneumothorax.

The cardiomediastinal silhouette is borderline normal in size; mild
dilatation of the ascending thoracic aorta is again characterized.
No acute osseous abnormalities are seen.
IMPRESSION: 1.  Minimally displaced fractures of the left 5th through 10th
lateral ribs noted.
2.  Known right-sided bronchiectasis better characterized on prior
CT; no acute focal consolidation seen.
3.  Mild dilatation of the ascending thoracic aorta again noted, as
seen on prior CT.

## 2014-03-29 ENCOUNTER — Encounter (HOSPITAL_COMMUNITY): Payer: Self-pay | Admitting: Emergency Medicine

## 2014-03-29 ENCOUNTER — Emergency Department (HOSPITAL_COMMUNITY)
Admission: EM | Admit: 2014-03-29 | Discharge: 2014-03-29 | Disposition: A | Discharge status: Home / Self Care | Payer: Medicare Other | Attending: Emergency Medicine | Admitting: Emergency Medicine

## 2014-03-29 DIAGNOSIS — Z8659 Personal history of other mental and behavioral disorders: Secondary | ICD-10-CM | POA: Insufficient documentation

## 2014-03-29 DIAGNOSIS — Z8739 Personal history of other diseases of the musculoskeletal system and connective tissue: Secondary | ICD-10-CM | POA: Insufficient documentation

## 2014-03-29 DIAGNOSIS — H9190 Unspecified hearing loss, unspecified ear: Secondary | ICD-10-CM | POA: Insufficient documentation

## 2014-03-29 DIAGNOSIS — I5020 Unspecified systolic (congestive) heart failure: Secondary | ICD-10-CM | POA: Insufficient documentation

## 2014-03-29 DIAGNOSIS — Z79899 Other long term (current) drug therapy: Secondary | ICD-10-CM | POA: Insufficient documentation

## 2014-03-29 DIAGNOSIS — R52 Pain, unspecified: Secondary | ICD-10-CM | POA: Diagnosis present

## 2014-03-29 DIAGNOSIS — Z8719 Personal history of other diseases of the digestive system: Secondary | ICD-10-CM | POA: Diagnosis not present

## 2014-03-29 DIAGNOSIS — N390 Urinary tract infection, site not specified: Secondary | ICD-10-CM | POA: Diagnosis not present

## 2014-03-29 DIAGNOSIS — Z880 Allergy status to penicillin: Secondary | ICD-10-CM | POA: Diagnosis not present

## 2014-03-29 DIAGNOSIS — Z8709 Personal history of other diseases of the respiratory system: Secondary | ICD-10-CM | POA: Diagnosis not present

## 2014-03-29 LAB — CBC WITH DIFFERENTIAL/PLATELET
Basophils Absolute: 0 10*3/uL (ref 0.0–0.1)
Basophils Relative: 1 % (ref 0–1)
EOS ABS: 0.1 10*3/uL (ref 0.0–0.7)
Eosinophils Relative: 2 % (ref 0–5)
HEMATOCRIT: 41.9 % (ref 39.0–52.0)
HEMOGLOBIN: 14.3 g/dL (ref 13.0–17.0)
LYMPHS PCT: 34 % (ref 12–46)
Lymphs Abs: 1.8 10*3/uL (ref 0.7–4.0)
MCH: 30.8 pg (ref 26.0–34.0)
MCHC: 34.1 g/dL (ref 30.0–36.0)
MCV: 90.3 fL (ref 78.0–100.0)
MONOS PCT: 12 % (ref 3–12)
Monocytes Absolute: 0.6 10*3/uL (ref 0.1–1.0)
NEUTROS ABS: 2.6 10*3/uL (ref 1.7–7.7)
Neutrophils Relative %: 51 % (ref 43–77)
PLATELETS: 213 10*3/uL (ref 150–400)
RBC: 4.64 MIL/uL (ref 4.22–5.81)
RDW: 13.5 % (ref 11.5–15.5)
WBC: 5.1 10*3/uL (ref 4.0–10.5)

## 2014-03-29 LAB — COMPREHENSIVE METABOLIC PANEL
ALT: 16 U/L (ref 0–53)
ANION GAP: 11 (ref 5–15)
AST: 28 U/L (ref 0–37)
Albumin: 4 g/dL (ref 3.5–5.2)
Alkaline Phosphatase: 67 U/L (ref 39–117)
BILIRUBIN TOTAL: 1 mg/dL (ref 0.3–1.2)
BUN: 17 mg/dL (ref 6–23)
CO2: 20 mmol/L (ref 19–32)
Calcium: 9.4 mg/dL (ref 8.4–10.5)
Chloride: 109 mEq/L (ref 96–112)
Creatinine, Ser: 1.09 mg/dL (ref 0.50–1.35)
GFR, EST AFRICAN AMERICAN: 66 mL/min — AB (ref >90)
GFR, EST NON AFRICAN AMERICAN: 57 mL/min — AB (ref >90)
GLUCOSE: 115 mg/dL — AB (ref 70–99)
POTASSIUM: 3.9 mmol/L (ref 3.5–5.1)
Sodium: 140 mmol/L (ref 135–145)
Total Protein: 7.2 g/dL (ref 6.0–8.3)

## 2014-03-29 LAB — URINALYSIS, ROUTINE W REFLEX MICROSCOPIC
BILIRUBIN URINE: NEGATIVE
GLUCOSE, UA: NEGATIVE mg/dL
HGB URINE DIPSTICK: NEGATIVE
Ketones, ur: 15 mg/dL — AB
NITRITE: POSITIVE — AB
PROTEIN: 30 mg/dL — AB
Specific Gravity, Urine: 1.021 (ref 1.005–1.030)
Urobilinogen, UA: 1 mg/dL (ref 0.0–1.0)
pH: 7.5 (ref 5.0–8.0)

## 2014-03-29 LAB — URINE MICROSCOPIC-ADD ON

## 2014-03-29 MED ORDER — SODIUM CHLORIDE 0.9 % IV BOLUS (SEPSIS)
500.0000 mL | Freq: Once | INTRAVENOUS | Status: AC
Start: 2014-03-29 — End: 2014-03-29
  Administered 2014-03-29: 500 mL via INTRAVENOUS

## 2014-03-29 MED ORDER — CEPHALEXIN 500 MG PO CAPS: 500.0000 mg | ORAL_CAPSULE | Freq: Two times a day (BID) | ORAL | Status: DC

## 2014-03-29 MED ORDER — CEPHALEXIN 250 MG PO CAPS
500.0000 mg | ORAL_CAPSULE | Freq: Once | ORAL | Status: AC
  Administered 2014-03-29: 500 mg via ORAL
  Filled 2014-03-29: qty 2

## 2014-03-29 NOTE — Progress Notes (Signed)
CSW met with patient's POA, Mr. Vinie Sill, to discuss concerns.  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  Patient stayed two months in an ALF two years ago and asked to come home.  Patient has had 24/7 care in his home for the past two years and it is using up his assets.  POA states patient still owns his home and has approximately $60,000 in stocks.  POA has talked to the patient about going back to an ALF as it would be cheaper than the care he is receiving and might get better care.  The patient has stated he would be willing to do this.  CSW explained to POA that since the patient has a safe environment to return to upon discharge that if he is not admitted to the hospital he will be discharged home.  The POA should have the patient see his PCP this week and coordinate with a selected ALF to place patient.  The POA states they are interested in [facility] and patient will be self pay.  CSW also recommended the POA consult and Elder Attorney to discuss the best way to manage the patient's assets as he moves into assisted living.  CSW consulted with medical team, diagnosis is UTI and he can follow up with his PCP given a prescription for anti-biotics, this was explained to the POA and that it would be the reason that he would call in the middle of the night stating he needed to go somewhere.  POA thanked the team for the assistance and states he will follow up.  CSW signing off.  [facility] Aldrick Derrig Richardo Priest ED CSW [phone]

## 2014-03-29 NOTE — ED Notes (Signed)
Urinal placed.  Pt will attempt to void.

## 2014-03-29 NOTE — ED Provider Notes (Signed)
CSN: [id number]     Arrival date & time 03/29/14  [id number] History   First MD Initiated Contact with Patient 03/29/14 0445     Chief Complaint  Patient presents with  . Altered Mental Status  . Generalized Body Aches     (Consider location/radiation/quality/duration/timing/severity/associated sxs/prior Treatment) HPI Patient presents with the complaint of "pain all over". He is unable to specify when the pain began. He is unable to specify where he has pain. He called his power of attorney this morning and asked to be brought to the emergency department. When asked specifically if he has chest pain the patient says no. When asked specifically he has abdominal pain the patient says no. When asked he has shortness of breath, nausea or vomiting the patient says no. The patient denies any difficulty urinating urinating or dysuria. Past Medical History  Diagnosis Date  . DJD (degenerative joint disease)   . Anxiety   . Depression   . Insomnia   . Duodenal ulcer   . Bronchiectasis     ct chest 05/09/10  . Systolic heart failure     with ejection fracture of 30%  . Aortic regurgitation     mild to moderate  . Tricuspid regurgitation     mild to moderate  . CHF (congestive heart failure)    Past Surgical History  Procedure Laterality Date  . Left hydrocele    . Hernia repair    . Hemorrhoid surgery    . Total knee arthroplasty      right  . Appendectomy    . Bilateral cataract repair     Family History  Problem Relation Age of Onset  . Heart disease Mother    History  Substance Use Topics  . Smoking status: Never Smoker   . Smokeless tobacco: Never Used  . Alcohol Use: No    Review of Systems  Respiratory: Negative for shortness of breath.   Cardiovascular: Negative for chest pain.  Gastrointestinal: Negative for vomiting and abdominal pain.  Genitourinary: Negative for dysuria and difficulty urinating.      Allergies  Penicillins  Home Medications   Prior to  Admission medications   Medication Sig Start Date End Date Taking? Authorizing Provider  donepezil (ARICEPT) 10 MG tablet Take 10 mg by mouth at bedtime.   Yes Historical Provider, MD  feeding supplement (ENSURE COMPLETE) LIQD Take 237 mLs by mouth 3 (three) times daily between meals. 07/17/11  Yes Adeline Joselyn Glassman Viyuoh, MD  finasteride (PROSCAR) 5 MG tablet Once a day    Yes Historical Provider, MD  Multiple Vitamin (MULTIVITAMIN) capsule Take 1 capsule by mouth daily.    Yes Historical Provider, MD  senna (SENOKOT) 8.6 MG TABS Take 1 tablet (8.6 mg total) by mouth at bedtime. 07/17/11  Yes Adeline Joselyn Glassman Viyuoh, MD  amLODipine (NORVASC) 10 MG tablet Take 1 tablet (10 mg total) by mouth daily at 6 PM. 07/17/11 07/16/12  Kela MillinAdeline C Viyuoh, MD  cephALEXin (KEFLEX) 500 MG capsule Take 1 capsule (500 mg total) by mouth 2 (two) times daily. 03/29/14   Tilden FossaElizabeth Rees, MD  metoCLOPramide (REGLAN) 5 MG tablet Take 1 tablet (5 mg total) by mouth 4 (four) times daily -  before meals and at bedtime. 07/17/11 07/27/11  Kela MillinAdeline C Viyuoh, MD   BP 121/69 mmHg  Pulse 76  Temp(Src) 98.9 F (37.2 C) (Oral)  Resp 19  Ht 5\' 8"  (1.727 m)  Wt 160 lb (72.576 kg)  BMI 24.33 kg/m2  SpO2 98%  Physical Exam  Constitutional: He is oriented to person, place, and time. He appears well-developed and well-nourished. No distress.  Hard of hearing.   HENT:  Head: Normocephalic and atraumatic.  Mouth/Throat: Oropharynx is clear and moist.  Eyes: EOM are normal. Pupils are equal, round, and reactive to light.  Neck: Normal range of motion. Neck supple.  No meningismus  Cardiovascular: Normal rate and regular rhythm.   Pulmonary/Chest: Effort normal and breath sounds normal. No respiratory distress. He has no wheezes. He has no rales. He exhibits no tenderness.  Abdominal: Soft. Bowel sounds are normal. He exhibits no distension and no mass. There is no tenderness. There is no rebound and no guarding.  Musculoskeletal: Normal range of motion.  He exhibits no edema or tenderness.  Neurological: He is alert and oriented to person, place, and time.  Moves all extremities without any deficit. Sensation is grossly intact. Lip smacking which is normal for the patient  Skin: Skin is warm and dry. No rash noted. No erythema.  Psychiatric: He has a normal mood and affect. His behavior is normal.  Nursing note and vitals reviewed.   ED Course  Procedures (including critical care time) Labs Review Labs Reviewed  COMPREHENSIVE METABOLIC PANEL - Abnormal; Notable for the following:    Glucose, Bld 115 (*)    GFR calc non Af Amer 57 (*)    GFR calc Af Amer 66 (*)    All other components within normal limits  URINALYSIS, ROUTINE W REFLEX MICROSCOPIC - Abnormal; Notable for the following:    APPearance TURBID (*)    Ketones, ur 15 (*)    Protein, ur 30 (*)    Nitrite POSITIVE (*)    Leukocytes, UA MODERATE (*)    All other components within normal limits  URINE MICROSCOPIC-ADD ON - Abnormal; Notable for the following:    Bacteria, UA MANY (*)    All other components within normal limits  URINE CULTURE  CBC WITH DIFFERENTIAL    Imaging Review No results found.   EKG Interpretation None      MDM   Final diagnoses:  UTI (lower urinary tract infection)   Patient currently denying any pain. Awaiting urine for UA to rule out UTI. Signed out to oncoming physician      Loren Racer, MD 03/30/14 [phone]

## 2014-03-29 NOTE — ED Notes (Signed)
Unable to void, is concerned we are not doing anything to help him void.  Explained tx thus far several times but pt remains confused.

## 2014-03-29 NOTE — ED Notes (Signed)
Unable to get urine return with in and out cath

## 2014-03-29 NOTE — Discharge Instructions (Signed)

## 2014-03-29 NOTE — ED Notes (Signed)
Pt remains confused more so than his normal baseline confusion.  Pt HC POA told me that pt is a fall risk and cannot be left alone.  He has someone with him 24/7.  Pt has steadily become more and more of a burden on him and his wife, has gone through most of his private funds and is looking to have him placed in a long term care facility.  They have [facility] in mind.

## 2014-03-29 NOTE — ED Notes (Signed)
Patient called his POA and told him he was having pain all over. Friend states he has baseline confusion, but is more confused than normal. Patient not a great historian. Alert to self, but not time or situation.

## 2014-03-29 NOTE — ED Provider Notes (Signed)
Patient here for evaluation of pain, patient is without any pain complaints in the emergency Department. UA is consistent with UTI. On Keflex. Cultures pending.  Tilden FossaElizabeth Emanie Behan, MD 03/29/14 1726

## 2014-03-31 LAB — URINE CULTURE

## 2014-04-01 ENCOUNTER — Telehealth (HOSPITAL_BASED_OUTPATIENT_CLINIC_OR_DEPARTMENT_OTHER): Payer: Self-pay | Admitting: Emergency Medicine

## 2014-04-01 NOTE — Telephone Encounter (Signed)
Post ED Visit - Positive Culture Follow-up  Culture report reviewed by antimicrobial stewardship pharmacist: []  Wes Dulaney, Pharm.D., BCPS [x]  Celedonio MiyamotoJeremy Frens, Pharm.D., BCPS []  Georgina PillionElizabeth Martin, [address] BCPS []  Hollow CreekMinh Pham, [place]arm.D., BCPS, AAHIVP []  Estella HuskMichelle Turner, Pharm.D., BCPS, AAHIVP []  Elder CyphersLorie Poole, [address]Ph[address]D., BCPS  Positive urine culture E. Coli Treated with cephalexin, organism sensitive to the same and no further patient follow-up is required at this time.  Berle MullMiller, Ann-Marie Kluge 04/01/2014, 2:04 PM

## 2014-08-05 ENCOUNTER — Emergency Department (HOSPITAL_COMMUNITY)
Admission: EM | Admit: 2014-08-05 | Discharge: 2014-08-05 | Disposition: A | Discharge status: Home / Self Care | Payer: Medicare Other | Attending: Emergency Medicine | Admitting: Emergency Medicine

## 2014-08-05 ENCOUNTER — Encounter (HOSPITAL_COMMUNITY): Payer: Self-pay

## 2014-08-05 DIAGNOSIS — K625 Hemorrhage of anus and rectum: Secondary | ICD-10-CM | POA: Diagnosis present

## 2014-08-05 DIAGNOSIS — Z87440 Personal history of urinary (tract) infections: Secondary | ICD-10-CM | POA: Diagnosis not present

## 2014-08-05 DIAGNOSIS — Z792 Long term (current) use of antibiotics: Secondary | ICD-10-CM | POA: Insufficient documentation

## 2014-08-05 DIAGNOSIS — R52 Pain, unspecified: Secondary | ICD-10-CM | POA: Insufficient documentation

## 2014-08-05 DIAGNOSIS — F419 Anxiety disorder, unspecified: Secondary | ICD-10-CM | POA: Insufficient documentation

## 2014-08-05 DIAGNOSIS — Z880 Allergy status to penicillin: Secondary | ICD-10-CM | POA: Insufficient documentation

## 2014-08-05 DIAGNOSIS — G4700 Insomnia, unspecified: Secondary | ICD-10-CM | POA: Insufficient documentation

## 2014-08-05 DIAGNOSIS — Z8709 Personal history of other diseases of the respiratory system: Secondary | ICD-10-CM | POA: Diagnosis not present

## 2014-08-05 DIAGNOSIS — I5020 Unspecified systolic (congestive) heart failure: Secondary | ICD-10-CM | POA: Diagnosis not present

## 2014-08-05 DIAGNOSIS — F329 Major depressive disorder, single episode, unspecified: Secondary | ICD-10-CM | POA: Insufficient documentation

## 2014-08-05 DIAGNOSIS — Z8739 Personal history of other diseases of the musculoskeletal system and connective tissue: Secondary | ICD-10-CM | POA: Insufficient documentation

## 2014-08-05 DIAGNOSIS — Z8719 Personal history of other diseases of the digestive system: Secondary | ICD-10-CM | POA: Insufficient documentation

## 2014-08-05 DIAGNOSIS — F0390 Unspecified dementia without behavioral disturbance: Secondary | ICD-10-CM | POA: Diagnosis not present

## 2014-08-05 DIAGNOSIS — Z79899 Other long term (current) drug therapy: Secondary | ICD-10-CM | POA: Insufficient documentation

## 2014-08-05 LAB — COMPREHENSIVE METABOLIC PANEL WITH GFR
ALT: 16 U/L — ABNORMAL LOW (ref 17–63)
AST: 23 U/L (ref 15–41)
Albumin: 3.8 g/dL (ref 3.5–5.0)
Alkaline Phosphatase: 113 U/L (ref 38–126)
Anion gap: 12 (ref 5–15)
BUN: 19 mg/dL (ref 6–20)
CO2: 24 mmol/L (ref 22–32)
Calcium: 9.4 mg/dL (ref 8.9–10.3)
Chloride: 101 mmol/L (ref 101–111)
Creatinine, Ser: 0.91 mg/dL (ref 0.61–1.24)
GFR calc Af Amer: >60 mL/min
GFR calc non Af Amer: >60 mL/min
Glucose, Bld: 83 mg/dL (ref 65–99)
Potassium: 4 mmol/L (ref 3.5–5.1)
Sodium: 137 mmol/L (ref 135–145)
Total Bilirubin: 1 mg/dL (ref 0.3–1.2)
Total Protein: 7 g/dL (ref 6.5–8.1)

## 2014-08-05 LAB — URINALYSIS, ROUTINE W REFLEX MICROSCOPIC
Bilirubin Urine: NEGATIVE
GLUCOSE, UA: NEGATIVE mg/dL
Hgb urine dipstick: NEGATIVE
Ketones, ur: 15 mg/dL — AB
Leukocytes, UA: NEGATIVE
Nitrite: NEGATIVE
Protein, ur: NEGATIVE mg/dL
Specific Gravity, Urine: 1.013 (ref 1.005–1.030)
Urobilinogen, UA: 1 mg/dL (ref 0.0–1.0)
pH: 8.5 — ABNORMAL HIGH (ref 5.0–8.0)

## 2014-08-05 LAB — CBC
HCT: 41.1 % (ref 39.0–52.0)
Hemoglobin: 13.9 g/dL (ref 13.0–17.0)
MCH: 30.9 pg (ref 26.0–34.0)
MCHC: 33.8 g/dL (ref 30.0–36.0)
MCV: 91.3 fL (ref 78.0–100.0)
Platelets: 264 10*3/uL (ref 150–400)
RBC: 4.5 MIL/uL (ref 4.22–5.81)
RDW: 13.4 % (ref 11.5–15.5)
WBC: 5.7 10*3/uL (ref 4.0–10.5)

## 2014-08-05 LAB — PROTIME-INR
INR: 1.02 (ref 0.00–1.49)
Prothrombin Time: 13.7 seconds (ref 11.6–15.2)

## 2014-08-05 LAB — TYPE AND SCREEN
ABO/RH(D): A POS
ANTIBODY SCREEN: NEGATIVE

## 2014-08-05 LAB — I-STAT TROPONIN, ED: Troponin i, poc: 0.01 ng/mL (ref 0.00–0.08)

## 2014-08-05 LAB — ABO/RH: ABO/RH(D): A POS

## 2014-08-05 LAB — POC OCCULT BLOOD, ED: Fecal Occult Bld: NEGATIVE

## 2014-08-05 LAB — LIPASE, BLOOD: LIPASE: 23 U/L (ref 22–51)

## 2014-08-05 NOTE — ED Notes (Signed)
Report given back to the facility.

## 2014-08-05 NOTE — ED Notes (Signed)
On rectal exam, no rectal bleeding noted, hemoccult obtained with digital exam, brown stool, no bleeding noted

## 2014-08-05 NOTE — ED Notes (Signed)
[facility] with complaints of bright red rectal bleeding since yesterday evening.  PTAR [facility] not [facility] the bleeding.  PTAR states patient complaining of "feeling bad".

## 2014-08-05 NOTE — ED Notes (Signed)
PTAR called for transport back to facility 

## 2014-08-05 NOTE — ED Notes (Signed)
Bed: [id number] Expected date:  Expected time:  Means of arrival:  Comments: EMS 81 yo with rectal bleed

## 2014-08-05 NOTE — ED Provider Notes (Signed)
CSN: [id number]     Arrival date & time 08/05/14  0504 History   First MD Initiated Contact with Patient 08/05/14 [phone]     Chief Complaint  Patient presents with  . Rectal Bleeding     (Consider location/radiation/quality/duration/timing/severity/associated sxs/prior Treatment) HPI Russell Fisher is a 81 y.o. male with history of anxiety, heart failure, bronchiectasis, degenerative joint disease, presents to emergency department from assisted living facility with complaint of "feeling bad" and bright red blood per rectum. Patient does have history of some dementia, and is a poor historian. Nursing home staff admit that they did not see any blood in his stool, but they do not go with him to the restroom each time. Patient at this time complaining of no pain, states she is feeling better, he denies any symptoms. Family stated the patient is mildly confused, but mostly at his baseline. They state that when he gets some confusion is usually from UTIs.  Past Medical History  Diagnosis Date  . DJD (degenerative joint disease)   . Anxiety   . Depression   . Insomnia   . Duodenal ulcer   . Bronchiectasis     ct chest 05/09/10  . Systolic heart failure     with ejection fracture of 30%  . Aortic regurgitation     mild to moderate  . Tricuspid regurgitation     mild to moderate  . CHF (congestive heart failure)    Past Surgical History  Procedure Laterality Date  . Left hydrocele    . Hernia repair    . Hemorrhoid surgery    . Total knee arthroplasty      right  . Appendectomy    . Bilateral cataract repair     Family History  Problem Relation Age of Onset  . Heart disease Mother    History  Substance Use Topics  . Smoking status: Never Smoker   . Smokeless tobacco: Never Used  . Alcohol Use: No    Review of Systems  Unable to perform ROS: Dementia      Allergies  Penicillins  Home Medications   Prior to Admission medications   Medication Sig Start Date End Date  Taking? Authorizing Provider  acetaminophen (TYLENOL) 500 MG tablet Take 500 mg by mouth every 6 (six) hours as needed for mild pain, fever or headache.   Yes Historical Provider, MD  alum & mag hydroxide-simeth (MAALOX/MYLANTA) 200-200-20 MG/5ML suspension Take 30 mLs by mouth every 6 (six) hours as needed for indigestion or heartburn.   Yes Historical Provider, MD  carboxymethylcellulose (REFRESH PLUS) 0.5 % SOLN Place 1 drop into both eyes 3 (three) times daily as needed.   Yes Historical Provider, MD  finasteride (PROSCAR) 5 MG tablet Once a day    Yes Historical Provider, MD  guaifenesin (ROBITUSSIN) 100 MG/5ML syrup Take 200 mg by mouth 3 (three) times daily as needed for cough.   Yes Historical Provider, MD  loperamide (IMODIUM) 2 MG capsule Take 2 mg by mouth as needed for diarrhea or loose stools.   Yes Historical Provider, MD  LORazepam (ATIVAN) 0.5 MG tablet Take 0.5 mg by mouth 2 (two) times daily.   Yes Historical Provider, MD  Menthol 3.1 % GEL Apply 1 application topically 2 (two) times daily.   Yes Historical Provider, MD  metoCLOPramide (REGLAN) 5 MG tablet Take 1 tablet (5 mg total) by mouth 4 (four) times daily -  before meals and at bedtime. 07/17/11 08/05/14 Yes Kela Millin, MD  mirtazapine (REMERON) 15 MG tablet Take 45 mg by mouth at bedtime.   Yes Historical Provider, MD  Multiple Vitamin (MULTIVITAMIN) capsule Take 1 capsule by mouth daily.    Yes Historical Provider, MD  Multiple Vitamins-Minerals (ICAPS AREDS FORMULA PO) Take 1 tablet by mouth daily.   Yes Historical Provider, MD  polyethylene glycol (MIRALAX / GLYCOLAX) packet Take 17 g by mouth daily.   Yes Historical Provider, MD  sertraline (ZOLOFT) 50 MG tablet Take 50 mg by mouth daily.   Yes Historical Provider, MD  traMADol (ULTRAM) 50 MG tablet Take 100 mg by mouth every 3 (three) hours as needed for moderate pain.   Yes Historical Provider, MD  amLODipine (NORVASC) 10 MG tablet Take 1 tablet (10 mg total) by  mouth daily at 6 PM. 07/17/11 07/16/12  Kela MillinAdeline C Viyuoh, MD  cephALEXin (KEFLEX) 500 MG capsule Take 1 capsule (500 mg total) by mouth 2 (two) times daily. Patient not taking: Reported on 08/05/2014 03/29/14   Tilden FossaElizabeth Rees, MD  feeding supplement (ENSURE COMPLETE) LIQD Take 237 mLs by mouth 3 (three) times daily between meals. Patient not taking: Reported on 08/05/2014 07/17/11   Kela MillinAdeline C Viyuoh, MD  senna (SENOKOT) 8.6 MG TABS Take 1 tablet (8.6 mg total) by mouth at bedtime. Patient not taking: Reported on 08/05/2014 07/17/11   Kela MillinAdeline C Viyuoh, MD   BP 109/85 mmHg  Pulse 67  Temp(Src) 98.6 F (37 C) (Oral)  Resp 16  Wt 166 lb (75.297 kg)  SpO2 98% Physical Exam  Constitutional: He appears well-developed and well-nourished. No distress.  HENT:  Head: Normocephalic and atraumatic.  Eyes: Conjunctivae are normal.  Neck: Neck supple.  Cardiovascular: Normal rate, regular rhythm and normal heart sounds.   Pulmonary/Chest: Effort normal. No respiratory distress. He has no wheezes. He has no rales.  Abdominal: Soft. Bowel sounds are normal. He exhibits no distension. There is no tenderness. There is no rebound.  Genitourinary: Rectum normal. Guaiac negative stool.  Musculoskeletal: He exhibits no edema.  Neurological: He is alert.  Skin: Skin is warm and dry.  Nursing note and vitals reviewed.   ED Course  Procedures (including critical care time) Labs Review Labs Reviewed  COMPREHENSIVE METABOLIC PANEL - Abnormal; Notable for the following:    ALT 16 (*)    All other components within normal limits  URINALYSIS, ROUTINE W REFLEX MICROSCOPIC - Abnormal; Notable for the following:    pH 8.5 (*)    Ketones, ur 15 (*)    All other components within normal limits  CBC  LIPASE, BLOOD  PROTIME-INR  POC OCCULT BLOOD, ED  POC OCCULT BLOOD, ED  I-STAT TROPOININ, ED  TYPE AND SCREEN  ABO/RH    Imaging Review No results found.   EKG Interpretation None      MDM   Final  diagnoses:  Generalized pain   Pt is here with generalized malaise, possible rectal bleeding. On exam, normal rectum, stool brown, hemoccult negative. VS norma. By the time i evaluated pt, he had no complaints. Denies any pain. Will get labs.    7:09 AM Patient continues to have no complaints. Family at bedside. H&H normal. History of UTIs will add urinalysis.   8:03 AM UA normal. Home with outpatient follow up.  Discussed plan with the family who agree  Filed Vitals:   08/05/14 0505 08/05/14 0630 08/05/14 0804  BP: 116/56 109/85 123/71  Pulse: 66 67 73  Temp: 98.6 F (37 C)    TempSrc: Oral  Resp: Weight: 166 lb (75.297 kg)    SpO2: 99% 98% 100%     Jaynie Crumble, PA-C 08/05/14 1525  Tomasita Crumble, MD 08/05/14 0454

## 2014-08-05 NOTE — Discharge Instructions (Signed)
Evaluation here unremarkable. Normal blood tests, normal UA, no rectal bleeding on exam or hemoccult. Please follow up with primary care doctor for recheck if symptoms continue.

## 2014-08-12 ENCOUNTER — Emergency Department (HOSPITAL_COMMUNITY)
Admission: EM | Admit: 2014-08-12 | Discharge: 2014-08-12 | Disposition: A | Discharge status: Home / Self Care | Payer: Medicare Other | Attending: Emergency Medicine | Admitting: Emergency Medicine

## 2014-08-12 ENCOUNTER — Encounter (HOSPITAL_COMMUNITY): Payer: Self-pay | Admitting: Emergency Medicine

## 2014-08-12 DIAGNOSIS — Z8709 Personal history of other diseases of the respiratory system: Secondary | ICD-10-CM | POA: Diagnosis not present

## 2014-08-12 DIAGNOSIS — M1990 Unspecified osteoarthritis, unspecified site: Secondary | ICD-10-CM | POA: Diagnosis not present

## 2014-08-12 DIAGNOSIS — Y9301 Activity, walking, marching and hiking: Secondary | ICD-10-CM | POA: Diagnosis not present

## 2014-08-12 DIAGNOSIS — W1839XA Other fall on same level, initial encounter: Secondary | ICD-10-CM | POA: Diagnosis not present

## 2014-08-12 DIAGNOSIS — F329 Major depressive disorder, single episode, unspecified: Secondary | ICD-10-CM | POA: Diagnosis not present

## 2014-08-12 DIAGNOSIS — G4700 Insomnia, unspecified: Secondary | ICD-10-CM | POA: Diagnosis not present

## 2014-08-12 DIAGNOSIS — F419 Anxiety disorder, unspecified: Secondary | ICD-10-CM | POA: Diagnosis not present

## 2014-08-12 DIAGNOSIS — Z8719 Personal history of other diseases of the digestive system: Secondary | ICD-10-CM | POA: Insufficient documentation

## 2014-08-12 DIAGNOSIS — W19XXXA Unspecified fall, initial encounter: Secondary | ICD-10-CM

## 2014-08-12 DIAGNOSIS — Y998 Other external cause status: Secondary | ICD-10-CM | POA: Insufficient documentation

## 2014-08-12 DIAGNOSIS — Z043 Encounter for examination and observation following other accident: Secondary | ICD-10-CM | POA: Diagnosis present

## 2014-08-12 DIAGNOSIS — Z79899 Other long term (current) drug therapy: Secondary | ICD-10-CM | POA: Diagnosis not present

## 2014-08-12 DIAGNOSIS — Y92129 Unspecified place in nursing home as the place of occurrence of the external cause: Secondary | ICD-10-CM | POA: Diagnosis not present

## 2014-08-12 DIAGNOSIS — I5020 Unspecified systolic (congestive) heart failure: Secondary | ICD-10-CM | POA: Insufficient documentation

## 2014-08-12 NOTE — ED Notes (Signed)
Bed: WA23 Expected date:  Expected time:  Means of arrival:  Comments: EMS-fall 

## 2014-08-12 NOTE — Discharge Instructions (Signed)

## 2014-08-12 NOTE — Progress Notes (Addendum)
Cm spoke with pt and family at bedside Pt from [facility] with fall  Family confirms pt has DME at facility including walker Family feels pt moves to fast with his rolling walker and their plan is to take home the rolling walker and bring pt's standard walker to the facility  ED SW updated  Pt offered a warm blanket as requested

## 2014-08-12 NOTE — ED Provider Notes (Signed)
CSN: [id number]     Arrival date & time 08/12/14  1538 History   First MD Initiated Contact with Patient 08/12/14 1544     Chief Complaint  Patient presents with  . Fall     (Consider location/radiation/quality/duration/timing/severity/associated sxs/prior Treatment) HPI The patient reportedly was walking in a brisk fashion and fell. Per report there was no loss of consciousness. And no evident injury. The patient denies pain to me. His POA has now subsequently arrived and I been able to interview him as well. He also has not had any report of complaint for localizing pain or dysfunction. Past Medical History  Diagnosis Date  . DJD (degenerative joint disease)   . Anxiety   . Depression   . Insomnia   . Duodenal ulcer   . Bronchiectasis     ct chest 05/09/10  . Systolic heart failure     with ejection fracture of 30%  . Aortic regurgitation     mild to moderate  . Tricuspid regurgitation     mild to moderate  . CHF (congestive heart failure)    Past Surgical History  Procedure Laterality Date  . Left hydrocele    . Hernia repair    . Hemorrhoid surgery    . Total knee arthroplasty      right  . Appendectomy    . Bilateral cataract repair     Family History  Problem Relation Age of Onset  . Heart disease Mother    History  Substance Use Topics  . Smoking status: Never Smoker   . Smokeless tobacco: Never Used  . Alcohol Use: No    Review of Systems 10 Systems reviewed and are negative for acute change except as noted in the HPI.    Allergies  Penicillins  Home Medications   Prior to Admission medications   Medication Sig Start Date End Date Taking? Authorizing Provider  acetaminophen (TYLENOL) 500 MG tablet Take 500 mg by mouth every 6 (six) hours as needed for mild pain, fever or headache.   Yes Historical Provider, MD  alum & mag hydroxide-simeth (MAALOX/MYLANTA) 200-200-20 MG/5ML suspension Take 30 mLs by mouth every 6 (six) hours as needed for  indigestion or heartburn.   Yes Historical Provider, MD  carboxymethylcellulose (REFRESH PLUS) 0.5 % SOLN Place 1 drop into both eyes 3 (three) times daily as needed.   Yes Historical Provider, MD  finasteride (PROSCAR) 5 MG tablet Take 5 mg by mouth daily.   Yes Historical Provider, MD  guaifenesin (ROBITUSSIN) 100 MG/5ML syrup Take 200 mg by mouth 3 (three) times daily as needed for cough.   Yes Historical Provider, MD  loperamide (IMODIUM) 2 MG capsule Take 2 mg by mouth as needed for diarrhea or loose stools.   Yes Historical Provider, MD  LORazepam (ATIVAN) 0.5 MG tablet Take 0.5 mg by mouth 2 (two) times daily.   Yes Historical Provider, MD  magnesium hydroxide (MILK OF MAGNESIA) 400 MG/5ML suspension Take 30 mLs by mouth at bedtime as needed for mild constipation.   Yes Historical Provider, MD  Menthol 3.1 % GEL Apply 1 application topically 2 (two) times daily as needed (pain).    Yes Historical Provider, MD  metoCLOPramide (REGLAN) 5 MG tablet Take 1 tablet (5 mg total) by mouth 4 (four) times daily -  before meals and at bedtime. 07/17/11 08/12/14 Yes Adeline C Viyuoh, MD  mirtazapine (REMERON) 15 MG tablet Take 45 mg by mouth at bedtime.   Yes Historical Provider, MD  Multiple Vitamin (MULTIVITAMIN) capsule Take 1 capsule by mouth daily.    Yes Historical Provider, MD  Multiple Vitamins-Minerals (ICAPS AREDS FORMULA PO) Take 1 tablet by mouth daily.   Yes Historical Provider, MD  neomycin-bacitracin-polymyxin (NEOSPORIN) [phone] ointment Apply 1 application topically as needed (small abraisions or irritations).   Yes Historical Provider, MD  polyethylene glycol (MIRALAX / GLYCOLAX) packet Take 17 g by mouth daily.   Yes Historical Provider, MD  sertraline (ZOLOFT) 50 MG tablet Take 50 mg by mouth daily.   Yes Historical Provider, MD  traMADol (ULTRAM) 50 MG tablet Take 100 mg by mouth every 3 (three) hours as needed for moderate pain.   Yes Historical Provider, MD   BP 135/65 mmHg  Pulse  58  Temp(Src) 98.5 F (36.9 C) (Oral)  Resp 30  SpO2 100% Physical Exam  Constitutional: He appears well-developed and well-nourished.  HENT:  Head: Normocephalic and atraumatic.  Right Ear: External ear normal.  Left Ear: External ear normal.  Mouth/Throat: Oropharynx is clear and moist.  Eyes: EOM are normal. Pupils are equal, round, and reactive to light.  Neck: Neck supple.  Cardiovascular: Normal rate, regular rhythm, normal heart sounds and intact distal pulses.   Pulmonary/Chest: Effort normal and breath sounds normal.  Abdominal: Soft. Bowel sounds are normal. He exhibits no distension. There is no tenderness.  Musculoskeletal: Normal range of motion. He exhibits no edema or tenderness.  Both upper extremities are put through range of motion and lower extremities are as well put through range of motion and internal and external rotation of the hips. Also direct compression to the trochanters. Does not endorse pain with any of these activities. No obvious contusions or abrasions that are new in appearance.  Neurological: He is alert. He has normal strength. No cranial nerve deficit. He exhibits normal muscle tone. Coordination normal. GCS eye subscore is 4. GCS verbal subscore is 5. GCS motor subscore is 6.  Skin: Skin is warm, dry and intact.  Psychiatric: He has a normal mood and affect.    ED Course  Procedures (including critical care time) Labs Review Labs Reviewed - No data to display  Imaging Review No results found.   EKG Interpretation None      MDM   Final diagnoses:  Fall at nursing home, initial encounter   The patient is calm and alert. There is no report of specified injury from his fall. He does not report pain and physical examination does not reveal any localizing injury. This history is cooperated by the POA. At this time I feel patient is safe for discharge and continued observation in the nursing home.    Arby BarretteMarcy Darilyn Storbeck, MD 08/12/14 [phone]

## 2014-08-12 NOTE — ED Notes (Signed)
[facility] was witnessed running around corner to bathroom, staff found patient around corner on ground without LOC and no apparent injury. Pt wearing c-collar. Pt denies pain to this RN. VSS, NAD

## 2015-02-23 ENCOUNTER — Encounter (HOSPITAL_COMMUNITY): Payer: Self-pay | Admitting: Emergency Medicine

## 2015-02-23 ENCOUNTER — Emergency Department (HOSPITAL_COMMUNITY)
Admission: EM | Admit: 2015-02-23 | Discharge: 2015-02-24 | Disposition: A | Discharge status: Home / Self Care | Payer: Medicare Other | Attending: Emergency Medicine | Admitting: Emergency Medicine

## 2015-02-23 ENCOUNTER — Emergency Department (HOSPITAL_COMMUNITY): Payer: Medicare Other

## 2015-02-23 DIAGNOSIS — Y998 Other external cause status: Secondary | ICD-10-CM | POA: Insufficient documentation

## 2015-02-23 DIAGNOSIS — I5020 Unspecified systolic (congestive) heart failure: Secondary | ICD-10-CM | POA: Diagnosis not present

## 2015-02-23 DIAGNOSIS — Z8709 Personal history of other diseases of the respiratory system: Secondary | ICD-10-CM | POA: Diagnosis not present

## 2015-02-23 DIAGNOSIS — F419 Anxiety disorder, unspecified: Secondary | ICD-10-CM | POA: Diagnosis not present

## 2015-02-23 DIAGNOSIS — G4700 Insomnia, unspecified: Secondary | ICD-10-CM | POA: Diagnosis not present

## 2015-02-23 DIAGNOSIS — W06XXXA Fall from bed, initial encounter: Secondary | ICD-10-CM | POA: Insufficient documentation

## 2015-02-23 DIAGNOSIS — Y9389 Activity, other specified: Secondary | ICD-10-CM | POA: Diagnosis not present

## 2015-02-23 DIAGNOSIS — Z880 Allergy status to penicillin: Secondary | ICD-10-CM | POA: Insufficient documentation

## 2015-02-23 DIAGNOSIS — S299XXA Unspecified injury of thorax, initial encounter: Secondary | ICD-10-CM | POA: Diagnosis present

## 2015-02-23 DIAGNOSIS — Z79899 Other long term (current) drug therapy: Secondary | ICD-10-CM | POA: Diagnosis not present

## 2015-02-23 DIAGNOSIS — Y92129 Unspecified place in nursing home as the place of occurrence of the external cause: Secondary | ICD-10-CM | POA: Diagnosis not present

## 2015-02-23 DIAGNOSIS — S79911A Unspecified injury of right hip, initial encounter: Secondary | ICD-10-CM | POA: Insufficient documentation

## 2015-02-23 DIAGNOSIS — F329 Major depressive disorder, single episode, unspecified: Secondary | ICD-10-CM | POA: Insufficient documentation

## 2015-02-23 DIAGNOSIS — Z8719 Personal history of other diseases of the digestive system: Secondary | ICD-10-CM | POA: Insufficient documentation

## 2015-02-23 DIAGNOSIS — S2231XA Fracture of one rib, right side, initial encounter for closed fracture: Secondary | ICD-10-CM | POA: Diagnosis not present

## 2015-02-23 NOTE — ED Notes (Signed)
Per [facility] , xray positive 9th right rib fracture. Pt denies sob nor chest pain. Pt co side pain to right flank area. Alert and oriented x 4. Normally ambulatory with walker assistance.

## 2015-02-23 NOTE — ED Notes (Signed)
Bed: [id number] Expected date:  Expected time:  Means of arrival:  Comments: 71M fall Rib fx on CXR

## 2015-02-23 NOTE — ED Provider Notes (Addendum)
earlier today at assisted living facility. He complains of right lateral rib pain. Denies abdominal pain. No other injury. on exam he is alert Glasgow Coma Score 15 lungs clear to auscultation. HEENT exam normocephalic atraumatic neck is supple, nontender. Entire spine is nontender Chest is tender at left side in mid axillary line into flail pelvis stable and nontender. All 4 extremities or contusion abrasion or tenderness neurovascularly intact  Doug SouSam Emmert Roethler, MD 02/23/15 2350  Doug SouSam Remi Lopata, MD 02/23/15 2350

## 2015-02-23 NOTE — ED Provider Notes (Signed)
CSN: [id number]     Arrival date & time 02/23/15  2036 History   First MD Initiated Contact with Patient 02/23/15 2128     Chief Complaint  Patient presents with  . Fall     (Consider location/radiation/quality/duration/timing/severity/associated sxs/prior Treatment) HPI Patient presents to the emergency department with right rib pain following a fall.  The patient fell out of his bed striking his right ribs against his walker.  Patient states that he did not his head.  There is no signs of injury.  Patient had rib x-rays that showed a rib fracture of the ninth rib on the right side.  The patient was sent here for pain control.  he denies any other injury other than mild pain in the right hip Past Medical History  Diagnosis Date  . DJD (degenerative joint disease)   . Anxiety   . Depression   . Insomnia   . Duodenal ulcer   . Bronchiectasis     ct chest 05/09/10  . Systolic heart failure     with ejection fracture of 30%  . Aortic regurgitation     mild to moderate  . Tricuspid regurgitation     mild to moderate  . CHF (congestive heart failure) [facility])    Past Surgical History  Procedure Laterality Date  . Left hydrocele    . Hernia repair    . Hemorrhoid surgery    . Total knee arthroplasty      right  . Appendectomy    . Bilateral cataract repair     Family History  Problem Relation Age of Onset  . Heart disease Mother    Social History  Substance Use Topics  . Smoking status: Never Smoker   . Smokeless tobacco: Never Used  . Alcohol Use: No    Review of Systems  All other systems negative except as documented in the HPI. All pertinent positives and negatives as reviewed in the HPI.   Allergies  Penicillins  Home Medications   Prior to Admission medications   Medication Sig Start Date End Date Taking? Authorizing Provider  acetaminophen (TYLENOL) 325 MG tablet Take 325 mg by mouth at bedtime as needed (for pain).   Yes Historical Provider, MD    acetaminophen (TYLENOL) 500 MG tablet Take 500 mg by mouth every 6 (six) hours as needed for mild pain, fever or headache.   Yes Historical Provider, MD  alum & mag hydroxide-simeth (MAALOX/MYLANTA) 200-200-20 MG/5ML suspension Take 30 mLs by mouth as needed for indigestion or heartburn.    Yes Historical Provider, MD  Carboxymeth-Glycerin-Polysorb (REFRESH OPTIVE ADVANCED) 0.5-1-0.5 % SOLN Place 1 drop into both eyes 2 (two) times daily as needed (for dry eyes).   Yes Historical Provider, MD  finasteride (PROSCAR) 5 MG tablet Take 5 mg by mouth daily with breakfast.    Yes Historical Provider, MD  guaifenesin (ROBITUSSIN) 100 MG/5ML syrup Take 200 mg by mouth every 6 (six) hours as needed for cough.    Yes Historical Provider, MD  loperamide (IMODIUM) 2 MG capsule Take 2 mg by mouth as needed for diarrhea or loose stools.   Yes Historical Provider, MD  LORazepam (ATIVAN) 0.5 MG tablet Take 0.5 mg by mouth 2 (two) times daily.   Yes Historical Provider, MD  LORazepam (ATIVAN) 0.5 MG tablet Take 0.5 mg by mouth 2 (two) times daily as needed for anxiety.   Yes Historical Provider, MD  magnesium hydroxide (MILK OF MAGNESIA) 400 MG/5ML suspension Take 30 mLs by mouth  at bedtime as needed for mild constipation.   Yes Historical Provider, MD  Menthol 3.1 % GEL Apply 1 application topically 2 (two) times daily as needed (pain).    Yes Historical Provider, MD  metoCLOPramide (REGLAN) 5 MG tablet Take 1 tablet (5 mg total) by mouth 4 (four) times daily -  before meals and at bedtime. Patient taking differently: Take 5 mg by mouth 3 (three) times daily.  07/17/11 02/23/15 Yes Adeline C Viyuoh, MD  mirtazapine (REMERON) 15 MG tablet Take 45 mg by mouth at bedtime.   Yes Historical Provider, MD  Multiple Vitamin (THERA/BETA-CAROTENE) TABS Take 1 tablet by mouth daily.   Yes Historical Provider, MD  Multiple Vitamins-Minerals (I-VITE) TABS Take 1 tablet by mouth daily with breakfast.   Yes Historical Provider, MD   neomycin-bacitracin-polymyxin (NEOSPORIN) [phone] ointment Apply 1 application topically as needed (small abraisions or irritations).   Yes Historical Provider, MD  polyethylene glycol (MIRALAX / GLYCOLAX) packet Take 17 g by mouth daily with breakfast.    Yes Historical Provider, MD  sertraline (ZOLOFT) 50 MG tablet Take 50 mg by mouth daily with breakfast.    Yes Historical Provider, MD  traMADol (ULTRAM) 50 MG tablet Take 100 mg by mouth 3 (three) times daily.    Yes Historical Provider, MD   BP 128/67 mmHg  Pulse 54  Temp(Src) 98.1 F (36.7 C) (Oral)  Resp 18  SpO2 96% Physical Exam  Constitutional: He appears well-developed and well-nourished. No distress.  HENT:  Head: Normocephalic and atraumatic.  Mouth/Throat: Oropharynx is clear and moist.  Eyes: Pupils are equal, round, and reactive to light.  Neck: Normal range of motion. Neck supple.  Cardiovascular: Normal rate, regular rhythm and normal heart sounds.  Exam reveals no gallop and no friction rub.   No murmur heard. Pulmonary/Chest: Effort normal and breath sounds normal. No respiratory distress. He has no wheezes. He exhibits tenderness.    Abdominal: Soft. Bowel sounds are normal. He exhibits no distension. There is no tenderness.  Musculoskeletal:       Right hip: He exhibits tenderness. He exhibits normal range of motion, normal strength and no bony tenderness.  Neurological: He is alert. He exhibits normal muscle tone. Coordination normal.  Skin: Skin is warm and dry. No rash noted. No erythema.  Psychiatric: He has a normal mood and affect. His behavior is normal.  Nursing note and vitals reviewed.   ED Course  Procedures (including critical care time) Labs Review Labs Reviewed - No data to display  Imaging Review Dg Hip Unilat With Pelvis 2-3 Views Right  02/23/2015  CLINICAL DATA:  Acute onset of right hip pain status post fall from bed. Initial encounter. EXAM: DG HIP (WITH OR WITHOUT PELVIS) 2-3V  RIGHT COMPARISON:  Abdominal radiograph performed 07/01/2011 FINDINGS: There is no evidence of acute fracture or dislocation. There is mild chronic deformity of the right inferior pubic ramus, stable from 2013. Both femoral heads are seated normally within their respective acetabula. The proximal right femur appears intact. Mild degenerative change is noted along the lower lumbar spine. The sacroiliac joints are unremarkable in appearance. The visualized bowel gas pattern is grossly unremarkable in appearance. IMPRESSION: No evidence of acute fracture or dislocation. Electronically Signed   By: Roanna Raider M.D.   On: 02/23/2015 23:30   I have personally reviewed and evaluated these images and lab results as part of my medical decision-making.   EKG Interpretation None        Patient will  be treated for the rib fracture.  Told to return here as needed.  Patient will be advised follow-up with primary care doctor.  Patient agrees the plan and was also made aware of the plan and all questions were answered  Charlestine NightChristopher Hailie Searight, PA-C 02/24/15 0023  Nelva Nayobert Beaton, MD 02/24/15 [phone]

## 2015-02-24 MED ORDER — TRAMADOL HCL 50 MG PO TABS: 50.0000 mg | ORAL_TABLET | Freq: Four times a day (QID) | ORAL | Status: AC | PRN

## 2015-02-24 NOTE — Discharge Instructions (Signed)
Return here as needed.  Follow-up with your primary care doctor.  Use ice and heat on your ribs °

## 2015-05-05 ENCOUNTER — Encounter (HOSPITAL_COMMUNITY): Payer: Self-pay | Admitting: *Deleted

## 2015-05-05 ENCOUNTER — Emergency Department (HOSPITAL_COMMUNITY): Payer: Medicare Other

## 2015-05-05 ENCOUNTER — Inpatient Hospital Stay (HOSPITAL_COMMUNITY)
Admission: EM | Admit: 2015-05-05 | Discharge: 2015-05-10 | DRG: 871 | Disposition: A | Discharge status: Nursing Home (Includes ICF) | Payer: Medicare Other | Attending: Internal Medicine | Admitting: Internal Medicine

## 2015-05-05 DIAGNOSIS — Y95 Nosocomial condition: Secondary | ICD-10-CM | POA: Diagnosis present

## 2015-05-05 DIAGNOSIS — F329 Major depressive disorder, single episode, unspecified: Secondary | ICD-10-CM | POA: Diagnosis present

## 2015-05-05 DIAGNOSIS — G4700 Insomnia, unspecified: Secondary | ICD-10-CM | POA: Diagnosis present

## 2015-05-05 DIAGNOSIS — A419 Sepsis, unspecified organism: Secondary | ICD-10-CM | POA: Diagnosis present

## 2015-05-05 DIAGNOSIS — J09X2 Influenza due to identified novel influenza A virus with other respiratory manifestations: Secondary | ICD-10-CM | POA: Diagnosis not present

## 2015-05-05 DIAGNOSIS — Z8249 Family history of ischemic heart disease and other diseases of the circulatory system: Secondary | ICD-10-CM

## 2015-05-05 DIAGNOSIS — H9190 Unspecified hearing loss, unspecified ear: Secondary | ICD-10-CM | POA: Diagnosis present

## 2015-05-05 DIAGNOSIS — F419 Anxiety disorder, unspecified: Secondary | ICD-10-CM | POA: Diagnosis present

## 2015-05-05 DIAGNOSIS — Z66 Do not resuscitate: Secondary | ICD-10-CM | POA: Diagnosis present

## 2015-05-05 DIAGNOSIS — J189 Pneumonia, unspecified organism: Secondary | ICD-10-CM | POA: Diagnosis not present

## 2015-05-05 DIAGNOSIS — I5022 Chronic systolic (congestive) heart failure: Secondary | ICD-10-CM | POA: Diagnosis present

## 2015-05-05 DIAGNOSIS — G9340 Encephalopathy, unspecified: Secondary | ICD-10-CM | POA: Diagnosis present

## 2015-05-05 DIAGNOSIS — N179 Acute kidney failure, unspecified: Secondary | ICD-10-CM | POA: Diagnosis present

## 2015-05-05 DIAGNOSIS — R410 Disorientation, unspecified: Secondary | ICD-10-CM

## 2015-05-05 DIAGNOSIS — I959 Hypotension, unspecified: Secondary | ICD-10-CM

## 2015-05-05 DIAGNOSIS — R509 Fever, unspecified: Secondary | ICD-10-CM | POA: Diagnosis present

## 2015-05-05 DIAGNOSIS — J09X1 Influenza due to identified novel influenza A virus with pneumonia: Secondary | ICD-10-CM | POA: Diagnosis present

## 2015-05-05 LAB — BRAIN NATRIURETIC PEPTIDE: B Natriuretic Peptide: 75.7 pg/mL (ref 0.0–100.0)

## 2015-05-05 LAB — URINALYSIS, ROUTINE W REFLEX MICROSCOPIC
Bilirubin Urine: NEGATIVE
GLUCOSE, UA: NEGATIVE mg/dL
HGB URINE DIPSTICK: NEGATIVE
Ketones, ur: NEGATIVE mg/dL
Leukocytes, UA: NEGATIVE
Nitrite: NEGATIVE
PH: 7 (ref 5.0–8.0)
Protein, ur: NEGATIVE mg/dL
SPECIFIC GRAVITY, URINE: 1.022 (ref 1.005–1.030)

## 2015-05-05 LAB — INFLUENZA PANEL BY PCR (TYPE A & B)
H1N1 flu by pcr: NOT DETECTED
INFLAPCR: POSITIVE — AB
INFLBPCR: NEGATIVE

## 2015-05-05 LAB — COMPREHENSIVE METABOLIC PANEL
ALT: 22 U/L (ref 17–63)
AST: 34 U/L (ref 15–41)
Albumin: 3.9 g/dL (ref 3.5–5.0)
Alkaline Phosphatase: 70 U/L (ref 38–126)
Anion gap: 11 (ref 5–15)
BUN: 21 mg/dL — AB (ref 6–20)
CHLORIDE: 104 mmol/L (ref 101–111)
CO2: 23 mmol/L (ref 22–32)
Calcium: 9 mg/dL (ref 8.9–10.3)
Creatinine, Ser: 1.27 mg/dL — ABNORMAL HIGH (ref 0.61–1.24)
GFR, EST AFRICAN AMERICAN: 55 mL/min — AB (ref >60)
GFR, EST NON AFRICAN AMERICAN: 47 mL/min — AB (ref >60)
Glucose, Bld: 100 mg/dL — ABNORMAL HIGH (ref 65–99)
POTASSIUM: 3.9 mmol/L (ref 3.5–5.1)
SODIUM: 138 mmol/L (ref 135–145)
Total Bilirubin: 0.8 mg/dL (ref 0.3–1.2)
Total Protein: 7.2 g/dL (ref 6.5–8.1)

## 2015-05-05 LAB — CBC WITH DIFFERENTIAL/PLATELET
BASOS ABS: 0 10*3/uL (ref 0.0–0.1)
BASOS PCT: 0 %
Basophils Absolute: 0 10*3/uL (ref 0.0–0.1)
Basophils Relative: 0 %
Eosinophils Absolute: 0 10*3/uL (ref 0.0–0.7)
Eosinophils Absolute: 0 10*3/uL (ref 0.0–0.7)
Eosinophils Relative: 0 %
Eosinophils Relative: 0 %
HCT: 42.2 % (ref 39.0–52.0)
HEMATOCRIT: 37 % — AB (ref 39.0–52.0)
HEMOGLOBIN: 14.1 g/dL (ref 13.0–17.0)
HEMOGLOBIN: 12.1 g/dL — AB (ref 13.0–17.0)
LYMPHS ABS: 0.4 10*3/uL — AB (ref 0.7–4.0)
LYMPHS PCT: 8 %
LYMPHS PCT: 11 %
Lymphs Abs: 0.4 10*3/uL — ABNORMAL LOW (ref 0.7–4.0)
MCH: 30.6 pg (ref 26.0–34.0)
MCH: 30.1 pg (ref 26.0–34.0)
MCHC: 33.4 g/dL (ref 30.0–36.0)
MCHC: 32.7 g/dL (ref 30.0–36.0)
MCV: 91.5 fL (ref 78.0–100.0)
MCV: 92 fL (ref 78.0–100.0)
MONO ABS: 0.4 10*3/uL (ref 0.1–1.0)
Monocytes Absolute: 0.4 10*3/uL (ref 0.1–1.0)
Monocytes Relative: 8 %
Monocytes Relative: 12 %
NEUTROS ABS: 2.6 10*3/uL (ref 1.7–7.7)
NEUTROS PCT: 84 %
NEUTROS PCT: 77 %
Neutro Abs: 3.5 10*3/uL (ref 1.7–7.7)
Platelets: 159 10*3/uL (ref 150–400)
Platelets: 130 10*3/uL — ABNORMAL LOW (ref 150–400)
RBC: 4.61 MIL/uL (ref 4.22–5.81)
RBC: 4.02 MIL/uL — AB (ref 4.22–5.81)
RDW: 14.7 % (ref 11.5–15.5)
RDW: 14.8 % (ref 11.5–15.5)
WBC: 4.3 10*3/uL (ref 4.0–10.5)
WBC: 3.4 10*3/uL — ABNORMAL LOW (ref 4.0–10.5)

## 2015-05-05 LAB — LACTIC ACID, PLASMA
LACTIC ACID, VENOUS: 1 mmol/L (ref 0.5–2.0)
Lactic Acid, Venous: 0.9 mmol/L (ref 0.5–2.0)

## 2015-05-05 LAB — STREP PNEUMONIAE URINARY ANTIGEN: STREP PNEUMO URINARY ANTIGEN: NEGATIVE

## 2015-05-05 LAB — PROTIME-INR
INR: 1.19 (ref 0.00–1.49)
PROTHROMBIN TIME: 14.8 s (ref 11.6–15.2)

## 2015-05-05 LAB — I-STAT CG4 LACTIC ACID, ED
LACTIC ACID, VENOUS: 1.45 mmol/L (ref 0.5–2.0)
Lactic Acid, Venous: 2.14 mmol/L (ref 0.5–2.0)

## 2015-05-05 LAB — APTT: APTT: 35 s (ref 24–37)

## 2015-05-05 LAB — MRSA PCR SCREENING: MRSA BY PCR: NEGATIVE

## 2015-05-05 LAB — PROCALCITONIN

## 2015-05-05 MED ORDER — POLYETHYLENE GLYCOL 3350 17 G PO PACK
17.0000 g | PACK | Freq: Every day | ORAL | Status: DC
  Administered 2015-05-06 – 2015-05-09 (×4): 17 g via ORAL
  Filled 2015-05-05 (×4): qty 1

## 2015-05-05 MED ORDER — DEXTROSE 5 % IV SOLN
2.0000 g | Freq: Once | INTRAVENOUS | Status: AC
  Administered 2015-05-05: 2 g via INTRAVENOUS
  Filled 2015-05-05: qty 2

## 2015-05-05 MED ORDER — VANCOMYCIN HCL IN DEXTROSE 1-5 GM/200ML-% IV SOLN
1000.0000 mg | INTRAVENOUS | Status: DC
  Administered 2015-05-06 – 2015-05-09 (×4): 1000 mg via INTRAVENOUS
  Filled 2015-05-05 (×4): qty 200

## 2015-05-05 MED ORDER — SODIUM CHLORIDE 0.9 % IV BOLUS (SEPSIS)
1000.0000 mL | INTRAVENOUS | Status: AC
  Administered 2015-05-05: 1000 mL via INTRAVENOUS

## 2015-05-05 MED ORDER — ONDANSETRON HCL 4 MG PO TABS: 4.0000 mg | ORAL_TABLET | Freq: Four times a day (QID) | ORAL | Status: DC | PRN

## 2015-05-05 MED ORDER — SODIUM CHLORIDE 0.9% FLUSH
3.0000 mL | Freq: Two times a day (BID) | INTRAVENOUS | Status: DC
  Administered 2015-05-06 – 2015-05-08 (×4): 3 mL via INTRAVENOUS

## 2015-05-05 MED ORDER — SERTRALINE HCL 50 MG PO TABS
50.0000 mg | ORAL_TABLET | Freq: Every day | ORAL | Status: DC
  Administered 2015-05-06 – 2015-05-10 (×5): 50 mg via ORAL
  Filled 2015-05-05 (×5): qty 1

## 2015-05-05 MED ORDER — ONDANSETRON HCL 4 MG/2ML IJ SOLN: 4.0000 mg | Freq: Four times a day (QID) | INTRAMUSCULAR | Status: DC | PRN

## 2015-05-05 MED ORDER — SODIUM CHLORIDE 0.9 % IV SOLN
INTRAVENOUS | Status: AC
  Administered 2015-05-05: 18:00:00 via INTRAVENOUS

## 2015-05-05 MED ORDER — CETYLPYRIDINIUM CHLORIDE 0.05 % MT LIQD
7.0000 mL | Freq: Two times a day (BID) | OROMUCOSAL | Status: DC
  Administered 2015-05-06 – 2015-05-09 (×7): 7 mL via OROMUCOSAL

## 2015-05-05 MED ORDER — ACETAMINOPHEN 650 MG RE SUPP: 650.0000 mg | Freq: Four times a day (QID) | RECTAL | Status: DC | PRN

## 2015-05-05 MED ORDER — CARBAMIDE PEROXIDE 6.5 % OT SOLN
5.0000 [drp] | Freq: Two times a day (BID) | OTIC | Status: DC
  Administered 2015-05-05 – 2015-05-10 (×10): 5 [drp] via OTIC
  Filled 2015-05-05: qty 15

## 2015-05-05 MED ORDER — VANCOMYCIN HCL IN DEXTROSE 1-5 GM/200ML-% IV SOLN
1000.0000 mg | Freq: Once | INTRAVENOUS | Status: AC
  Administered 2015-05-05: 1000 mg via INTRAVENOUS
  Filled 2015-05-05: qty 200

## 2015-05-05 MED ORDER — CHLORHEXIDINE GLUCONATE 0.12 % MT SOLN
15.0000 mL | Freq: Two times a day (BID) | OROMUCOSAL | Status: DC
  Administered 2015-05-05 – 2015-05-10 (×10): 15 mL via OROMUCOSAL
  Filled 2015-05-05 (×8): qty 15

## 2015-05-05 MED ORDER — ACETAMINOPHEN 325 MG PO TABS: 650.0000 mg | ORAL_TABLET | Freq: Four times a day (QID) | ORAL | Status: DC | PRN

## 2015-05-05 MED ORDER — METOCLOPRAMIDE HCL 10 MG PO TABS
5.0000 mg | ORAL_TABLET | Freq: Three times a day (TID) | ORAL | Status: DC
  Administered 2015-05-05 – 2015-05-10 (×18): 5 mg via ORAL
  Filled 2015-05-05 (×17): qty 1

## 2015-05-05 MED ORDER — LORAZEPAM 0.5 MG PO TABS
0.5000 mg | ORAL_TABLET | Freq: Two times a day (BID) | ORAL | Status: DC | PRN
  Administered 2015-05-06 – 2015-05-09 (×4): 0.5 mg via ORAL
  Filled 2015-05-05 (×4): qty 1

## 2015-05-05 MED ORDER — MAGNESIUM HYDROXIDE 400 MG/5ML PO SUSP: 30.0000 mL | Freq: Every evening | ORAL | Status: DC | PRN

## 2015-05-05 MED ORDER — SACCHAROMYCES BOULARDII 250 MG PO CAPS
250.0000 mg | ORAL_CAPSULE | Freq: Two times a day (BID) | ORAL | Status: DC
  Administered 2015-05-05 – 2015-05-10 (×10): 250 mg via ORAL
  Filled 2015-05-05 (×10): qty 1

## 2015-05-05 MED ORDER — GUAIFENESIN 100 MG/5ML PO SYRP
200.0000 mg | ORAL_SOLUTION | Freq: Four times a day (QID) | ORAL | Status: DC | PRN
  Filled 2015-05-05: qty 10

## 2015-05-05 MED ORDER — DEXTROSE 5 % IV SOLN
1.0000 g | Freq: Three times a day (TID) | INTRAVENOUS | Status: DC
  Administered 2015-05-05 – 2015-05-09 (×12): 1 g via INTRAVENOUS
  Filled 2015-05-05 (×14): qty 1

## 2015-05-05 MED ORDER — ENOXAPARIN SODIUM 40 MG/0.4ML ~~LOC~~ SOLN
40.0000 mg | SUBCUTANEOUS | Status: DC
Start: 2015-05-05 — End: 2015-05-10
  Administered 2015-05-05 – 2015-05-09 (×5): 40 mg via SUBCUTANEOUS
  Filled 2015-05-05 (×5): qty 0.4

## 2015-05-05 MED ORDER — SODIUM CHLORIDE 0.9 % IV BOLUS (SEPSIS)
1000.0000 mL | Freq: Once | INTRAVENOUS | Status: AC
  Administered 2015-05-05: 1000 mL via INTRAVENOUS

## 2015-05-05 MED ORDER — OSELTAMIVIR PHOSPHATE 30 MG PO CAPS
30.0000 mg | ORAL_CAPSULE | Freq: Two times a day (BID) | ORAL | Status: AC
Start: 2015-05-05 — End: 2015-05-10
  Administered 2015-05-05 – 2015-05-10 (×10): 30 mg via ORAL
  Filled 2015-05-05 (×10): qty 1

## 2015-05-05 MED ORDER — LEVOFLOXACIN IN D5W 750 MG/150ML IV SOLN
750.0000 mg | INTRAVENOUS | Status: DC
  Administered 2015-05-05 – 2015-05-07 (×2): 750 mg via INTRAVENOUS
  Filled 2015-05-05 (×3): qty 150

## 2015-05-05 MED ORDER — SODIUM CHLORIDE 0.9 % IV BOLUS (SEPSIS)
500.0000 mL | INTRAVENOUS | Status: AC
  Administered 2015-05-05: 500 mL via INTRAVENOUS

## 2015-05-05 MED ORDER — FINASTERIDE 5 MG PO TABS
5.0000 mg | ORAL_TABLET | Freq: Every day | ORAL | Status: DC
  Administered 2015-05-06 – 2015-05-10 (×5): 5 mg via ORAL
  Filled 2015-05-05 (×5): qty 1

## 2015-05-05 MED ORDER — ALBUTEROL SULFATE (2.5 MG/3ML) 0.083% IN NEBU: 2.5000 mg | INHALATION_SOLUTION | RESPIRATORY_TRACT | Status: DC | PRN

## 2015-05-05 MED ORDER — ACETAMINOPHEN 325 MG PO TABS
650.0000 mg | ORAL_TABLET | Freq: Four times a day (QID) | ORAL | Status: DC | PRN
  Administered 2015-05-05: 650 mg via ORAL
  Filled 2015-05-05: qty 2

## 2015-05-05 MED ORDER — ACETAMINOPHEN 650 MG RE SUPP: 650.0000 mg | RECTAL | Status: DC | PRN

## 2015-05-05 NOTE — ED Notes (Signed)
Pt is from [facility] 

## 2015-05-05 NOTE — H&P (Addendum)
[facility] History and Physical  TRAEVION POEHLER LKG:[id number] DOB: [birth year] DOA: 05/05/2015   PCP: Mayra Neer, MD  Specialists: Unknown  Chief Complaint: Cough since yesterday with fever  HPI: Russell Fisher is a 82 y.o. male with past medical history of congestive heart failure with a previously known Ef of ~30%, possible early dementia who lives at [facility]. He was brought into the hospital due to complaints of cough and fever. Patient is accompanied by his friends who are the power of attorney. Patient does not have any family. He has hearing impairment and has cognitive impairment and so is unable to provide any history. As far as we know there's been no nausea, vomiting. He's just had a cough for the past day or 2 with congestion in the chest. He had a fever today, which prompted him to come into the hospital. Unknown if there is been other members in the facility who have been sick. Immunization status is unknown. History is very limited.   In the emergency department he was found to have pulmonary infiltrates which are most likely infectious in nature. He was found to be hypotensive. Blood pressure improved with IV fluids. He will be brought into the hospital for further management.  Home Medications: Prior to Admission medications   Medication Sig Start Date End Date Taking? Authorizing Provider  acetaminophen (TYLENOL) 500 MG tablet Take 500 mg by mouth every 6 (six) hours as needed for mild pain, fever or headache.   Yes Historical Provider, MD  alum & mag hydroxide-simeth (MAALOX/MYLANTA) 200-200-20 MG/5ML suspension Take 30 mLs by mouth as needed for indigestion or heartburn.    Yes Historical Provider, MD  Carboxymeth-Glycerin-Polysorb (REFRESH OPTIVE ADVANCED) 0.5-1-0.5 % SOLN Place 1 drop into both eyes 2 (two) times daily as needed (for dry eyes).   Yes Historical Provider, MD  finasteride (PROSCAR) 5 MG tablet Take 5 mg by mouth daily with breakfast.     Yes Historical Provider, MD  guaifenesin (ROBITUSSIN) 100 MG/5ML syrup Take 200 mg by mouth every 6 (six) hours as needed for cough.    Yes Historical Provider, MD  loperamide (IMODIUM) 2 MG capsule Take 2 mg by mouth as needed for diarrhea or loose stools.   Yes Historical Provider, MD  LORazepam (ATIVAN) 0.5 MG tablet Take 0.5 mg by mouth 2 (two) times daily.   Yes Historical Provider, MD  LORazepam (ATIVAN) 0.5 MG tablet Take 0.5 mg by mouth 2 (two) times daily as needed for anxiety.   Yes Historical Provider, MD  magnesium hydroxide (MILK OF MAGNESIA) 400 MG/5ML suspension Take 30 mLs by mouth at bedtime as needed for mild constipation.   Yes Historical Provider, MD  metoCLOPramide (REGLAN) 5 MG tablet Take 1 tablet (5 mg total) by mouth 4 (four) times daily -  before meals and at bedtime. Patient taking differently: Take 5 mg by mouth 3 (three) times daily.  07/17/11 05/05/15 Yes Adeline C Viyuoh, MD  mirtazapine (REMERON) 15 MG tablet Take 45 mg by mouth at bedtime.   Yes Historical Provider, MD  Multiple Vitamin (THERA/BETA-CAROTENE) TABS Take 1 tablet by mouth daily.   Yes Historical Provider, MD  Multiple Vitamins-Minerals (I-VITE) TABS Take 1 tablet by mouth daily with breakfast.   Yes Historical Provider, MD  neomycin-bacitracin-polymyxin (NEOSPORIN) [phone] ointment Apply 1 application topically as needed (small abraisions or irritations).   Yes Historical Provider, MD  polyethylene glycol (MIRALAX / GLYCOLAX) packet Take 17 g by mouth daily with breakfast.  Yes Historical Provider, MD  sertraline (ZOLOFT) 50 MG tablet Take 50 mg by mouth daily with breakfast.    Yes Historical Provider, MD  traMADol (ULTRAM) 50 MG tablet Take 1 tablet (50 mg total) by mouth every 6 (six) hours as needed. 02/24/15  Yes Dalia Heading, PA-C    Allergies:  Allergies  Allergen Reactions  . Penicillins Swelling    Has patient had a PCN reaction causing immediate rash, facial/tongue/throat swelling,  SOB or lightheadedness with hypotension: unknown Has patient had a PCN reaction causing severe rash involving mucus membranes or skin necrosis: unknown Has patient had a PCN reaction that required hospitalization: unknown Has patient had a PCN reaction occurring within the last 10 years: unknown If all of the above answers are "NO", then may proceed with Cephalosporin use.     Past Medical History: Past Medical History  Diagnosis Date  . DJD (degenerative joint disease)   . Anxiety   . Depression   . Insomnia   . Duodenal ulcer   . Bronchiectasis     ct chest 05/09/10  . Systolic heart failure     with ejection fracture of 30%  . Aortic regurgitation     mild to moderate  . Tricuspid regurgitation     mild to moderate  . CHF (congestive heart failure) [facility])     Past Surgical History  Procedure Laterality Date  . Left hydrocele    . Hernia repair    . Hemorrhoid surgery    . Total knee arthroplasty      right  . Appendectomy    . Bilateral cataract repair      Social History: Lives at [facility] (?SNF). No history of smoking or alcohol use. Uses a walker to ambulate.  Family History:  Family History  Problem Relation Age of Onset  . Heart disease Mother      Review of Systems - unable to obtain due to confusion  Physical Examination  Filed Vitals:   05/05/15 0622 05/05/15 0700 05/05/15 0730 05/05/15 0933  BP:  112/65 103/54   Pulse:  71    Temp:    101.1 F (38.4 C)  TempSrc:    Rectal  Resp:  20    Height: 5' 8.11" (1.73 m)     Weight: 70.308 kg (155 lb)     SpO2:  94%      BP 103/54 mmHg  Pulse 71  Temp(Src) 101.1 F (38.4 C) (Rectal)  Resp 20  Ht 5' 8.11" (1.73 m)  Wt 70.308 kg (155 lb)  BMI 23.49 kg/m2  SpO2 94%  General appearance: alert, appears stated age, distracted and no distress Head: Normocephalic, without obvious abnormality, atraumatic Eyes: conjunctivae/corneas clear. PERRL, EOM's intact.  Throat: Slightly dry mucous  membranes Neck: no adenopathy, no carotid bruit, no JVD, supple, symmetrical, trachea midline and thyroid not enlarged, symmetric, no tenderness/mass/nodules Resp: Coarse breath sounds bilaterally with few crackles at the bases. No wheezing. Cardio: regular rate and rhythm, S1, S2 normal, no murmur, click, rub or gallop GI: soft, non-tender; bowel sounds normal; no masses,  no organomegaly Extremities: extremities normal, atraumatic, no cyanosis or edema Pulses: 2+ and symmetric Skin: Skin color, texture, turgor normal. No rashes or lesions Lymph nodes: Cervical, supraclavicular, and axillary nodes normal. Neurologic: , Alert, distracted. No facial asymmetry. Moving all his extremities. No obvious focal deficits noted.  Laboratory Data: Results for orders placed or performed during the hospital encounter of 05/05/15 (from the past 48 hour(s))  Comprehensive  metabolic panel     Status: Abnormal   Collection Time: 05/05/15  5:15 AM  Result Value Ref Range   Sodium 138 135 - 145 mmol/L   Potassium 3.9 3.5 - 5.1 mmol/L   Chloride 104 101 - 111 mmol/L   CO2 23 22 - 32 mmol/L   Glucose, Bld 100 (H) 65 - 99 mg/dL   BUN 21 (H) 6 - 20 mg/dL   Creatinine, Ser 1.27 (H) 0.61 - 1.24 mg/dL   Calcium 9.0 8.9 - 10.3 mg/dL   Total Protein 7.2 6.5 - 8.1 g/dL   Albumin 3.9 3.5 - 5.0 g/dL   AST 34 15 - 41 U/L   ALT 22 17 - 63 U/L   Alkaline Phosphatase 70 38 - 126 U/L   Total Bilirubin 0.8 0.3 - 1.2 mg/dL   GFR calc non Af Amer 47 (L) >60 mL/min   GFR calc Af Amer 55 (L) >60 mL/min    Comment: (NOTE) The eGFR has been calculated using the CKD EPI equation. This calculation has not been validated in all clinical situations. eGFR's persistently <60 mL/min signify possible Chronic Kidney Disease.    Anion gap 11 5 - 15  CBC WITH DIFFERENTIAL     Status: Abnormal   Collection Time: 05/05/15  5:15 AM  Result Value Ref Range   WBC 4.3 4.0 - 10.5 K/uL   RBC 4.61 4.22 - 5.81 MIL/uL   Hemoglobin 14.1  13.0 - 17.0 g/dL   HCT 42.2 39.0 - 52.0 %   MCV 91.5 78.0 - 100.0 fL   MCH 30.6 26.0 - 34.0 pg   MCHC 33.4 30.0 - 36.0 g/dL   RDW 14.7 11.5 - 15.5 %   Platelets 159 150 - 400 K/uL   Neutrophils Relative % 84 %   Neutro Abs 3.5 1.7 - 7.7 K/uL   Lymphocytes Relative 8 %   Lymphs Abs 0.4 (L) 0.7 - 4.0 K/uL   Monocytes Relative 8 %   Monocytes Absolute 0.4 0.1 - 1.0 K/uL   Eosinophils Relative 0 %   Eosinophils Absolute 0.0 0.0 - 0.7 K/uL   Basophils Relative 0 %   Basophils Absolute 0.0 0.0 - 0.1 K/uL  Brain natriuretic peptide     Status: None   Collection Time: 05/05/15  5:15 AM  Result Value Ref Range   B Natriuretic Peptide 75.7 0.0 - 100.0 pg/mL  I-Stat CG4 Lactic Acid, ED  (not at  [facility])     Status: None   Collection Time: 05/05/15  6:15 AM  Result Value Ref Range   Lactic Acid, Venous 1.45 0.5 - 2.0 mmol/L  Urinalysis, Routine w reflex microscopic (not at [facility])     Status: None   Collection Time: 05/05/15  6:32 AM  Result Value Ref Range   Color, Urine YELLOW YELLOW   APPearance CLEAR CLEAR   Specific Gravity, Urine 1.022 1.005 - 1.030   pH 7.0 5.0 - 8.0   Glucose, UA NEGATIVE NEGATIVE mg/dL   Hgb urine dipstick NEGATIVE NEGATIVE   Bilirubin Urine NEGATIVE NEGATIVE   Ketones, ur NEGATIVE NEGATIVE mg/dL   Protein, ur NEGATIVE NEGATIVE mg/dL   Nitrite NEGATIVE NEGATIVE   Leukocytes, UA NEGATIVE NEGATIVE    Comment: MICROSCOPIC NOT DONE ON URINES WITH NEGATIVE PROTEIN, BLOOD, LEUKOCYTES, NITRITE, OR GLUCOSE <1000 mg/dL.  I-Stat CG4 Lactic Acid, ED  (not at  [facility])     Status: Abnormal   Collection Time: 05/05/15 10:27 AM  Result Value Ref Range  Lactic Acid, Venous 2.14 (HH) 0.5 - 2.0 mmol/L   Comment NOTIFIED PHYSICIAN     Radiology Reports: Dg Chest Port 1 View  05/05/2015  CLINICAL DATA:  Increased weakness and fever over last few days at skilled nursing facility question pneumonia, history CHF, bronchiectasis, valvular heart disease EXAM: PORTABLE CHEST 1 VIEW  COMPARISON:  Portable exam 0000 hours compared to 02/23/2015 FINDINGS: Enlargement cardiac silhouette. Calcified tortuous thoracic aorta. Increased interstitial infiltrates bilaterally since previous exam favor pulmonary edema over infection. RIGHT basilar atelectasis. No pleural effusion or pneumothorax. Bones demineralized. IMPRESSION: Enlargement of cardiac silhouette with increased BILATERAL pulmonary infiltrates favoring pulmonary edema and CHF. RIGHT basilar atelectasis. Electronically Signed   By: Lavonia Dana M.D.   On: 05/05/2015 08:20     Problem List  Principal Problem:   Healthcare-associated pneumonia Active Problems:   Sepsis ([place])   Acute encephalopathy   Assessment: This is a 82 year old Caucasian male with past medical history as stated earlier, who presents with cough and fever with x-ray findings suspicious for pneumonia. He has evidence for sepsis.  Plan: #1 Pneumonia, possibly healthcare associated with sepsis. Aspiration cannot be ruled out. Swallow evaluation will be obtained. Patient will be placed on vancomycin and aztreonam. Levaquin will also be initiated for atypical coverage. Sepsis protocol will be initiated. Lactic acid initially was normal with subsequent level was slightly elevated. These will be repeated. Influenza PCR will be checked.  #2 mild acute renal failure: His baseline BUN and creatinine appears to be 19 and 0.9 as of 2016. Mildly elevated today, possibly due to acute infection. He'll be gently hydrated.   #3 history of congestive heart failure: No echocardiogram report is available in our system. There are some records which mention that his EF is about 30-35%. He appears to be reasonably well compensated. BNP is 75, so acute CHF is unlikely. Caution with IV fluids  #4 history of depression: Continue with his home medications  ADDENDUM Influenza PCR is positive for influenza A. Patient will be started on Tamiflu.  DVT Prophylaxis: Lovenox Code  Status: DO NOT RESUSCITATE Family Communication: No family available. He has close friends who are his power of attorney, who are at the bedside.  Disposition Plan: Admit to telemetry   Further management decisions will depend on results of further testing and patient's response to treatment.   [facility]  [facility] Pager [phone]  If 7PM-7AM, please contact night-coverage [email or web address] Password [facility]  05/05/2015, 11:15 AM

## 2015-05-05 NOTE — ED Provider Notes (Signed)
CSN: [id number]     Arrival date & time 05/05/15  0458 History   First MD Initiated Contact with Patient 05/05/15 [phone]     Chief Complaint  Patient presents with  . Pneumonia    questionable   Level V caveat for confusion and hard of hearing   (Consider location/radiation/quality/duration/timing/severity/associated sxs/prior Treatment) HPI patient's power of attorney is in the room. They state patient has been getting more confused over the past couple months. He is also hard of hearing. They report they see him 3 times a week. When they saw him yesterday they noted he had a cough. He also is complaining of back pain. They were called early this morning and were told he was having a fever of 103. They are not aware of any vomiting or diarrhea.   PCP  Dr Pincus Badder  Past Medical History  Diagnosis Date  . DJD (degenerative joint disease)   . Anxiety   . Depression   . Insomnia   . Duodenal ulcer   . Bronchiectasis     ct chest 05/09/10  . Systolic heart failure     with ejection fracture of 30%  . Aortic regurgitation     mild to moderate  . Tricuspid regurgitation     mild to moderate  . CHF (congestive heart failure) [facility])    Past Surgical History  Procedure Laterality Date  . Left hydrocele    . Hernia repair    . Hemorrhoid surgery    . Total knee arthroplasty      right  . Appendectomy    . Bilateral cataract repair     Family History  Problem Relation Age of Onset  . Heart disease Mother    Social History  Substance Use Topics  . Smoking status: Never Smoker   . Smokeless tobacco: Never Used  . Alcohol Use: No  lives in ALF Uses a walker No oxygen  Review of Systems  Unable to perform ROS: Other      Allergies  Penicillins  Home Medications   Prior to Admission medications   Medication Sig Start Date End Date Taking? Authorizing Provider  acetaminophen (TYLENOL) 500 MG tablet Take 500 mg by mouth every 6 (six) hours as needed for mild pain, fever  or headache.   Yes Historical Provider, MD  alum & mag hydroxide-simeth (MAALOX/MYLANTA) 200-200-20 MG/5ML suspension Take 30 mLs by mouth as needed for indigestion or heartburn.    Yes Historical Provider, MD  Carboxymeth-Glycerin-Polysorb (REFRESH OPTIVE ADVANCED) 0.5-1-0.5 % SOLN Place 1 drop into both eyes 2 (two) times daily as needed (for dry eyes).   Yes Historical Provider, MD  finasteride (PROSCAR) 5 MG tablet Take 5 mg by mouth daily with breakfast.    Yes Historical Provider, MD  guaifenesin (ROBITUSSIN) 100 MG/5ML syrup Take 200 mg by mouth every 6 (six) hours as needed for cough.    Yes Historical Provider, MD  loperamide (IMODIUM) 2 MG capsule Take 2 mg by mouth as needed for diarrhea or loose stools.   Yes Historical Provider, MD  LORazepam (ATIVAN) 0.5 MG tablet Take 0.5 mg by mouth 2 (two) times daily.   Yes Historical Provider, MD  LORazepam (ATIVAN) 0.5 MG tablet Take 0.5 mg by mouth 2 (two) times daily as needed for anxiety.   Yes Historical Provider, MD  magnesium hydroxide (MILK OF MAGNESIA) 400 MG/5ML suspension Take 30 mLs by mouth at bedtime as needed for mild constipation.   Yes Historical Provider, MD  metoCLOPramide (REGLAN) 5 MG tablet Take 1 tablet (5 mg total) by mouth 4 (four) times daily -  before meals and at bedtime. Patient taking differently: Take 5 mg by mouth 3 (three) times daily.  07/17/11 05/05/15 Yes Adeline C Viyuoh, MD  mirtazapine (REMERON) 15 MG tablet Take 45 mg by mouth at bedtime.   Yes Historical Provider, MD  Multiple Vitamin (THERA/BETA-CAROTENE) TABS Take 1 tablet by mouth daily.   Yes Historical Provider, MD  Multiple Vitamins-Minerals (I-VITE) TABS Take 1 tablet by mouth daily with breakfast.   Yes Historical Provider, MD  neomycin-bacitracin-polymyxin (NEOSPORIN) [id number] ointment Apply 1 application topically as needed (small abraisions or irritations).   Yes Historical Provider, MD  polyethylene glycol (MIRALAX / GLYCOLAX) packet Take 17 g by  mouth daily with breakfast.    Yes Historical Provider, MD  sertraline (ZOLOFT) 50 MG tablet Take 50 mg by mouth daily with breakfast.    Yes Historical Provider, MD  traMADol (ULTRAM) 50 MG tablet Take 1 tablet (50 mg total) by mouth every 6 (six) hours as needed. 02/24/15  Yes Christopher Lawyer, PA-C   BP 91/52 mmHg  Pulse 69  Temp(Src) 99.9 F (37.7 C) (Oral)  Resp 16  SpO2 98%  Vital signs normal except for hypotension  Physical Exam  Constitutional: He appears well-developed and well-nourished.  Non-toxic appearance. He does not appear ill. No distress.  Hard of hearing  HENT:  Head: Normocephalic and atraumatic.  Right Ear: External ear normal.  Left Ear: External ear normal.  Nose: Nose normal. No mucosal edema or rhinorrhea.  Mouth/Throat: Mucous membranes are normal. No dental abscesses or uvula swelling.  Mucus membranes dry  Eyes: Conjunctivae and EOM are normal. Pupils are equal, round, and reactive to light.  Neck: Normal range of motion and full passive range of motion without pain. Neck supple.  Cardiovascular: Normal rate, regular rhythm and normal heart sounds.  Exam reveals no gallop and no friction rub.   No murmur heard. Pulmonary/Chest: Effort normal. No respiratory distress. He has no wheezes. He has no rhonchi. He has rales. He exhibits no tenderness and no crepitus.  Rales in right base  Abdominal: Soft. Normal appearance and bowel sounds are normal. He exhibits no distension. There is no tenderness. There is no rebound and no guarding.  Musculoskeletal: Normal range of motion. He exhibits no edema or tenderness.  Moves all extremities well.   Neurological: He is alert. He has normal strength. No cranial nerve deficit.  Skin: Skin is warm, dry and intact. No rash noted. No erythema. No pallor.  Psychiatric: His speech is delayed and slurred. He is slowed.  Nursing note and vitals reviewed.   ED Course  Procedures (including critical care  time)  Medications  sodium chloride 0.9 % bolus 1,000 mL (0 mLs Intravenous Stopped 05/05/15 0832)    Followed by  sodium chloride 0.9 % bolus 500 mL (500 mLs Intravenous New Bag/Given 05/05/15 0836)  vancomycin (VANCOCIN) IVPB 1000 mg/200 mL premix (not administered)  aztreonam (AZACTAM) 1 g in dextrose 5 % 50 mL IVPB (not administered)  sodium chloride 0.9 % bolus 1,000 mL (not administered)  aztreonam (AZACTAM) 2 g in dextrose 5 % 50 mL IVPB (2 g Intravenous New Bag/Given 05/05/15 0832)  vancomycin (VANCOCIN) IVPB 1000 mg/200 mL premix (0 mg Intravenous Stopped 05/05/15 0759)    Patient was given IV fluids for his hypotension. He was started on IV vancomycin and Azactam for his healthcare associated pneumonia.  8:58 AM Dr.  Rito Ehrlich, hospitalist once mid minute to telemetry. He also wants a influenza test be done.  Labs Review Results for orders placed or performed during the hospital encounter of 05/05/15  Comprehensive metabolic panel  Result Value Ref Range   Sodium 138 135 - 145 mmol/L   Potassium 3.9 3.5 - 5.1 mmol/L   Chloride 104 101 - 111 mmol/L   CO2 23 22 - 32 mmol/L   Glucose, Bld 100 (H) 65 - 99 mg/dL   BUN 21 (H) 6 - 20 mg/dL   Creatinine, Ser 1.61 (H) 0.61 - 1.24 mg/dL   Calcium 9.0 8.9 - 09.6 mg/dL   Total Protein 7.2 6.5 - 8.1 g/dL   Albumin 3.9 3.5 - 5.0 g/dL   AST 34 15 - 41 U/L   ALT 22 17 - 63 U/L   Alkaline Phosphatase 70 38 - 126 U/L   Total Bilirubin 0.8 0.3 - 1.2 mg/dL   GFR calc non Af Amer 47 (L) >60 mL/min   GFR calc Af Amer 55 (L) >60 mL/min   Anion gap 11 5 - 15  CBC WITH DIFFERENTIAL  Result Value Ref Range   WBC 4.3 4.0 - 10.5 K/uL   RBC 4.61 4.22 - 5.81 MIL/uL   Hemoglobin 14.1 13.0 - 17.0 g/dL   HCT 04.5 40.9 - 81.1 %   MCV 91.5 78.0 - 100.0 fL   MCH 30.6 26.0 - 34.0 pg   MCHC 33.4 30.0 - 36.0 g/dL   RDW 91.4 78.2 - 95.6 %   Platelets 159 150 - 400 K/uL   Neutrophils Relative % 84 %   Neutro Abs 3.5 1.7 - 7.7 K/uL   Lymphocytes  Relative 8 %   Lymphs Abs 0.4 (L) 0.7 - 4.0 K/uL   Monocytes Relative 8 %   Monocytes Absolute 0.4 0.1 - 1.0 K/uL   Eosinophils Relative 0 %   Eosinophils Absolute 0.0 0.0 - 0.7 K/uL   Basophils Relative 0 %   Basophils Absolute 0.0 0.0 - 0.1 K/uL  Urinalysis, Routine w reflex microscopic (not at [facility])  Result Value Ref Range   Color, Urine YELLOW YELLOW   APPearance CLEAR CLEAR   Specific Gravity, Urine 1.022 1.005 - 1.030   pH 7.0 5.0 - 8.0   Glucose, UA NEGATIVE NEGATIVE mg/dL   Hgb urine dipstick NEGATIVE NEGATIVE   Bilirubin Urine NEGATIVE NEGATIVE   Ketones, ur NEGATIVE NEGATIVE mg/dL   Protein, ur NEGATIVE NEGATIVE mg/dL   Nitrite NEGATIVE NEGATIVE   Leukocytes, UA NEGATIVE NEGATIVE  I-Stat CG4 Lactic Acid, ED  (not at  [facility])  Result Value Ref Range   Lactic Acid, Venous 1.45 0.5 - 2.0 mmol/L   Laboratory interpretation all normal except renal insufficiency     Imaging Review Dg Chest Port 1 View  05/05/2015  CLINICAL DATA:  Increased weakness and fever over last few days at skilled nursing facility question pneumonia, history CHF, bronchiectasis, valvular heart disease EXAM: PORTABLE CHEST 1 VIEW COMPARISON:  Portable exam 0000 hours compared to 02/23/2015 FINDINGS: Enlargement cardiac silhouette. Calcified tortuous thoracic aorta. Increased interstitial infiltrates bilaterally since previous exam favor pulmonary edema over infection. RIGHT basilar atelectasis. No pleural effusion or pneumothorax. Bones demineralized. IMPRESSION: Enlargement of cardiac silhouette with increased BILATERAL pulmonary infiltrates favoring pulmonary edema and CHF. RIGHT basilar atelectasis. Electronically Signed   By: Ulyses Southward M.D.   On: 05/05/2015 08:20   I have personally reviewed and evaluated these images and lab results as part of my medical decision-making.  MDM   Final diagnoses:  Healthcare-associated pneumonia  Hypotension, unspecified hypotension type  Confusion   Plan  admission  Devoria Albe, MD, FACEP   CRITICAL CARE Performed by: Devoria Albe L Total critical care time: 35 minutes Critical care time was exclusive of separately billable procedures and treating other patients. Critical care was necessary to treat or prevent imminent or life-threatening deterioration. Critical care was time spent personally by me on the following activities: development of treatment plan with patient and/or surrogate as well as nursing, discussions with consultants, evaluation of patient's response to treatment, examination of patient, obtaining history from patient or surrogate, ordering and performing treatments and interventions, ordering and review of laboratory studies, ordering and review of radiographic studies, pulse oximetry and re-evaluation of patient's condition.   Devoria Albe, MD 05/05/15 [phone]

## 2015-05-05 NOTE — Progress Notes (Signed)
Received pt from ED, alert to self, VS obtained and telemetry applied, attempted to orient to room, call light placed in reach

## 2015-05-05 NOTE — Progress Notes (Addendum)
Pharmacy Antibiotic Note  Russell Fisher is a 82 y.o. male admitted on 05/05/2015 with HCAP.  Pharmacy has been consulted for Vancomycin, aztreonam dosing.  Plan: Vancomycin 1gm iv x1, then further dosing once SCr reports. Aztreonam 2gm iv x1, then further dosing once SCr reports.  Height: 5' 8.11" (173 cm) Weight: 155 lb (70.308 kg) IBW/kg (Calculated) : 68.65  Temp (24hrs), Avg:99.9 F (37.7 C), Min:99.9 F (37.7 C), Max:99.9 F (37.7 C)   Recent Labs Lab 05/05/15 0615  LATICACIDVEN 1.45    CrCl cannot be calculated (Patient has no serum creatinine result on file.).    Allergies  Allergen Reactions  . Penicillins Swelling    Has patient had a PCN reaction causing immediate rash, facial/tongue/throat swelling, SOB or lightheadedness with hypotension: unknown Has patient had a PCN reaction causing severe rash involving mucus membranes or skin necrosis: unknown Has patient had a PCN reaction that required hospitalization: unknown Has patient had a PCN reaction occurring within the last 10 years: unknown If all of the above answers are "NO", then may proceed with Cephalosporin use.     Antimicrobials this admission: Vancomycin 2/22 >>  Aztreonam 2/22 >>   Thank you for allowing pharmacy to be a part of this patient's care.  Russell Fisher 05/05/2015 6:50 AM    ADDENDUM SCr is 1.27, CrCl is 36 ml/min.  Will continue vancomycin 1000 mg IV q24h Will continue aztreonam 1000 mg IV q8h  Russell Fisher, PharmD, BCPS Pager [phone] 05/05/2015 7:55 AM

## 2015-05-05 NOTE — Clinical Social Work Note (Signed)
Clinical Social Work Assessment  Patient Details  Name: Russell Fisher MRN: [id number] Date of Birth: 04-Mar-1921  Date of referral:  05/05/15               Reason for consult:  Other (Comment Required) (Patient from facility, [facility])                Permission sought to share information with:    Permission granted to share information::     Name::        Agency::     Relationship::     Contact Information:     Housing/Transportation Living arrangements for the past 2 months:  Assisted Living Facility (Patient from [facility]) Source of Information:  Engineer, mining (CSW spoke with friend of patient, Trecia Rogers, [place]) Patient Interpreter Needed:  None Criminal Activity/Legal Involvement Pertinent to Current Situation/Hospitalization:  No - Comment as needed Significant Relationships:  Other(Comment) (Per patient's friend, patient has no family and he is his support) Lives with:  Facility Resident Do you feel safe going back to the place where you live?    Need for family participation in patient care:   (Per patient's friend, pateint has no family)  Care giving concerns: Per patient's friend, Trecia Rogers, patient does not have any family and he is patients main support. No concerns noted at this time.   Social Worker assessment / plan: CSW attempted to speak with patient at bedside. Patient was asleep at the time, however, patient's friend, Trecia Rogers was at bedside. He reports they have been good friends for some years. He reports that he is patients medical and financial POA. He reports patient has been at [facility] for six months. He reports patient became sick as he is now around three days ago. He reports patient has fallen a "time or two" and that patient dresses himself. He reports when patient is at the facility, he usually goes from his bed to the dining room and back using his walker. [name] reports patient is retired.   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  Emotional Assessment Appearance:  Appears stated age Attitude/Demeanor/Rapport:    Affect (typically observed):   (Patient was asleep at the time) Orientation:   (Unknown at this time) Alcohol / Substance use:  Not Applicable Psych involvement (Current and /or in the community):  No (Comment)  Discharge Needs  Concerns to be addressed:  Other (Comment Required (Unknown) Readmission within the last 30 days:    Current discharge risk:    Barriers to Discharge:      Claudean Severance, LCSW 05/05/2015, 1:31 PM

## 2015-05-05 NOTE — Progress Notes (Addendum)
Pharmacy - Brief Note (adding levofloxacin to current antibiotic regimen)  Please refer to pharmacist's note from earlier today for vancomycin/aztreonam dosing.  TRH asking pharmacy to dose levofloxacin for atypical coverage to current regimen of vancomycin/aztreonam.  Plan:  Based on current renal function of < 74ml/min, dose levofloxacin  IV q48h  Await cultures, serologies, rapid diagnostic tests for de-escalation.  Juliette Alcide, PharmD, BCPS.   Pager: [phone] 05/05/2015 3:47 PM   Addendum: Tamiflu adjusted to  PO BID  Juliette Alcide, PharmD, BCPS.   Pager: [phone] 05/05/2015 4:22 PM

## 2015-05-05 NOTE — ED Notes (Signed)
Bed: WA07 Expected date: 05/05/15 Expected time:  Means of arrival:  Comments: Fever

## 2015-05-05 NOTE — ED Notes (Signed)
NURSE AT BEDSIDE TO PLACE IV AND DRAW LABS.

## 2015-05-05 NOTE — ED Notes (Signed)
Staff at facility states that pt is too sick to stay at facility. Staff question that pt has pneumonia. Chest xray was performed at facility yesterday however per EMS results are still pending and staff states that they would be unable to treat pt with medicine as "drug truck" doesn't come again until 0100 05/06/15. Staff states that other residents are well however the nursing supervisor has PNA currently and has been working with residents

## 2015-05-06 DIAGNOSIS — A419 Sepsis, unspecified organism: Principal | ICD-10-CM

## 2015-05-06 DIAGNOSIS — J189 Pneumonia, unspecified organism: Secondary | ICD-10-CM

## 2015-05-06 DIAGNOSIS — J09X2 Influenza due to identified novel influenza A virus with other respiratory manifestations: Secondary | ICD-10-CM

## 2015-05-06 LAB — COMPREHENSIVE METABOLIC PANEL
ALT: 27 U/L (ref 17–63)
AST: 51 U/L — ABNORMAL HIGH (ref 15–41)
Albumin: 3.2 g/dL — ABNORMAL LOW (ref 3.5–5.0)
Alkaline Phosphatase: 62 U/L (ref 38–126)
Anion gap: 12 (ref 5–15)
BILIRUBIN TOTAL: 0.6 mg/dL (ref 0.3–1.2)
BUN: 16 mg/dL (ref 6–20)
CHLORIDE: 105 mmol/L (ref 101–111)
CO2: 19 mmol/L — ABNORMAL LOW (ref 22–32)
CREATININE: 0.97 mg/dL (ref 0.61–1.24)
Calcium: 8.2 mg/dL — ABNORMAL LOW (ref 8.9–10.3)
Glucose, Bld: 99 mg/dL (ref 65–99)
POTASSIUM: 3.9 mmol/L (ref 3.5–5.1)
Sodium: 136 mmol/L (ref 135–145)
TOTAL PROTEIN: 6.2 g/dL — AB (ref 6.5–8.1)

## 2015-05-06 LAB — CBC
HEMATOCRIT: 41.2 % (ref 39.0–52.0)
Hemoglobin: 13.5 g/dL (ref 13.0–17.0)
MCH: 30.1 pg (ref 26.0–34.0)
MCHC: 32.8 g/dL (ref 30.0–36.0)
MCV: 92 fL (ref 78.0–100.0)
PLATELETS: 128 10*3/uL — AB (ref 150–400)
RBC: 4.48 MIL/uL (ref 4.22–5.81)
RDW: 14.8 % (ref 11.5–15.5)
WBC: 3.7 10*3/uL — AB (ref 4.0–10.5)

## 2015-05-06 LAB — LEGIONELLA ANTIGEN, URINE

## 2015-05-06 LAB — HIV ANTIBODY (ROUTINE TESTING W REFLEX): HIV SCREEN 4TH GENERATION: NONREACTIVE

## 2015-05-06 LAB — URINE CULTURE: Culture: NO GROWTH

## 2015-05-06 MED ORDER — ENSURE ENLIVE PO LIQD
237.0000 mL | Freq: Three times a day (TID) | ORAL | Status: DC
  Administered 2015-05-06 – 2015-05-10 (×11): 237 mL via ORAL

## 2015-05-06 NOTE — Progress Notes (Signed)
CRITICAL VALUE ALERT  Critical value received: Positive Blood Cultures: Anaerobic Bottle growing gram positive cocci in clusters  Date of notification:  05/06/2015   Time of notification:  1535  Critical value read back:yes  Nurse who received alert:  Cyndy Freeze   MD notified (1st page): Dr. Benjamine Mola   Time of first page: 1543  Time MD responded: 1543- MD on the department. Told her verbally.

## 2015-05-06 NOTE — Progress Notes (Signed)
PROGRESS NOTE  Russell Fisher:[id number] DOB: Mar 21, 1920 DOA: 05/05/2015 PCP: Lupita Raider, MD  Assessment/Plan: Influenza A- tamiflu 2/22  Pneumonia, possibly healthcare associated with sepsis. SLP: DYS 3 with thin liquids vancomycin and aztreonam, Levaquin   mild acute renal failure:  Gentle IVF   history of congestive heart failure: No echocardiogram report is available in our system. There are some records which mention that his EF is about 30-35%. He appears to be reasonably well compensated. BNP is 75, so acute CHF is unlikely. Caution with IV fluids   history of depression: Continue with his home medications  PT eval in 1-2 days when feeling better, from ALF [facility])  Code Status: DNR Family Communication: MPOA at bedside Disposition Plan:    Consultants:    Procedures:     HPI/Subjective: Will open eyes say a few words   Objective: Filed Vitals:   05/05/15 1950 05/06/15 0500  BP: 103/54 128/64  Pulse: 57 65  Temp: 98.4 F (36.9 C) 98.4 F (36.9 C)  Resp: 19 18    Intake/Output Summary (Last 24 hours) at 05/06/15 1128 Last data filed at 05/06/15 9562  Gross per 24 hour  Intake 868.75 ml  Output    628 ml  Net 240.75 ml   Filed Weights   05/05/15 0622  Weight: 70.308 kg (155 lb)    Exam:   General:  lethargy  Cardiovascular: rrr  Respiratory: coarse breath sounds  Abdomen: +BS, soft  Musculoskeletal: no edema   Data Reviewed: Basic Metabolic Panel:  Recent Labs Lab 05/05/15 0515 05/06/15 0612  NA 138 136  K 3.9 3.9  CL 104 105  CO2 23 19*  GLUCOSE 100* 99  BUN 21* 16  CREATININE 1.27* 0.97  CALCIUM 9.0 8.2*   Liver Function Tests:  Recent Labs Lab 05/05/15 0515 05/06/15 0612  AST 34 51*  ALT 22 27  ALKPHOS 70 62  BILITOT 0.8 0.6  PROT 7.2 6.2*  ALBUMIN 3.9 3.2*   No results for input(s): LIPASE, AMYLASE in the last 168 hours. No results for input(s): AMMONIA in the last 168  hours. CBC:  Recent Labs Lab 05/05/15 0515 05/05/15 1750 05/06/15 0612  WBC 4.3 3.4* 3.7*  NEUTROABS 3.5 2.6  --   HGB 14.1 12.1* 13.5  HCT 42.2 37.0* 41.2  MCV 91.5 92.0 92.0  PLT 159 130* 128*   Cardiac Enzymes: No results for input(s): CKTOTAL, CKMB, CKMBINDEX, TROPONINI in the last 168 hours. BNP (last 3 results)  Recent Labs  05/05/15 0515  BNP 75.7    ProBNP (last 3 results) No results for input(s): PROBNP in the last 8760 hours.  CBG: No results for input(s): GLUCAP in the last 168 hours.  Recent Results (from the past 240 hour(s))  MRSA PCR Screening     Status: None   Collection Time: 05/05/15  6:20 PM  Result Value Ref Range Status   MRSA by PCR NEGATIVE NEGATIVE Final    Comment:        The GeneXpert MRSA Assay (FDA approved for NASAL specimens only), is one component of a comprehensive MRSA colonization surveillance program. It is not intended to diagnose MRSA infection nor to guide or monitor treatment for MRSA infections.      Studies: Dg Chest Port 1 View  05/05/2015  CLINICAL DATA:  Increased weakness and fever over last few days at skilled nursing facility question pneumonia, history CHF, bronchiectasis, valvular heart disease EXAM: PORTABLE CHEST 1 VIEW COMPARISON:  Portable exam 0000  hours compared to 02/23/2015 FINDINGS: Enlargement cardiac silhouette. Calcified tortuous thoracic aorta. Increased interstitial infiltrates bilaterally since previous exam favor pulmonary edema over infection. RIGHT basilar atelectasis. No pleural effusion or pneumothorax. Bones demineralized. IMPRESSION: Enlargement of cardiac silhouette with increased BILATERAL pulmonary infiltrates favoring pulmonary edema and CHF. RIGHT basilar atelectasis. Electronically Signed   By: Ulyses Southward M.D.   On: 05/05/2015 08:20    Scheduled Meds: . antiseptic oral rinse  7 mL Mouth Rinse q12n4p  . aztreonam  1 g Intravenous Q8H  . carbamide peroxide  5 drop Both Ears BID  .  chlorhexidine  15 mL Mouth Rinse BID  . enoxaparin (LOVENOX) injection  40 mg Subcutaneous Q24H  . finasteride  5 mg Oral Q breakfast  . levofloxacin (LEVAQUIN) IV  750 mg Intravenous Q48H  . metoCLOPramide  5 mg Oral TID AC & HS  . oseltamivir  30 mg Oral BID  . polyethylene glycol  17 g Oral Q breakfast  . saccharomyces boulardii  250 mg Oral BID  . sertraline  50 mg Oral Q breakfast  . sodium chloride flush  3 mL Intravenous Q12H  . vancomycin  1,000 mg Intravenous Q24H   Continuous Infusions:  Antibiotics Given (last 72 hours)    Date/Time Action Medication Dose Rate   05/05/15 1748 Given   levofloxacin (LEVAQUIN) IVPB 750 mg 750 mg 100 mL/hr   05/05/15 1801 Given   oseltamivir (TAMIFLU) capsule 30 mg 30 mg    05/06/15 0530 Given   vancomycin (VANCOCIN) IVPB 1000 mg/200 mL premix 1,000 mg 200 mL/hr   05/06/15 1610 Given   oseltamivir (TAMIFLU) capsule 30 mg 30 mg       Principal Problem:   Healthcare-associated pneumonia Active Problems:   Sepsis (HCC)   Acute encephalopathy   Influenza due to identified novel influenza A virus with other respiratory manifestations    Time spent: 25 min    JESSICA U [facility]  [facility] Pager [phone] If 7PM-7AM, please contact night-coverage at [email or web address], password [facility] 05/06/2015, 11:28 AM  LOS: 1 day

## 2015-05-06 NOTE — Evaluation (Signed)
Clinical/Bedside Swallow Evaluation Patient Details  Name: Russell Fisher MRN: [id number] Date of Birth: 1920-04-12  Today's Date: 05/06/2015 Time: SLP Start Time (ACUTE ONLY): 9562 SLP Stop Time (ACUTE ONLY): 0852 SLP Time Calculation (min) (ACUTE ONLY): 20 min  Past Medical History:  Past Medical History  Diagnosis Date  . DJD (degenerative joint disease)   . Anxiety   . Depression   . Insomnia   . Duodenal ulcer   . Bronchiectasis     ct chest 05/09/10  . Systolic heart failure     with ejection fracture of 30%  . Aortic regurgitation     mild to moderate  . Tricuspid regurgitation     mild to moderate  . CHF (congestive heart failure) (HCC)    Past Surgical History:  Past Surgical History  Procedure Laterality Date  . Left hydrocele    . Hernia repair    . Hemorrhoid surgery    . Total knee arthroplasty      right  . Appendectomy    . Bilateral cataract repair     HPI:  82 yo male adm to [facility] with HCAP, diagnosed with the flu.  PMH + for h/o CHF and dementia.  CXR showed bilateral infiltrates - consistent with CHF or edema.  Swallow evaluation ordered.     Assessment / Plan / Recommendation Clinical Impression  Pt presents with mild oral dysphagia consistent with  dysphagia associated with cognitive deficits.  Excessive orofacial movement noted, ? consistent with tardive dyskinesia.   He did not follow direction for OME but did self feed applesauce during session.    Oral "mastication" deficits noted with prolonged rotary movement requriring applesauce to faciliate oral transiting.  No indications of aspiration with all po observed *cracker, fruit, applesauce and water.  Pt did repeat "I've already had that" during session with clear voice throughout.    Recommend continue dys3/ground meats with supervision and using puree to aid oral transiting of foods.  SLP to sign off as compensation strategies in place for mitigation.   Recommend allowing pt to self feed as much  as able with full supervision.     Aspiration Risk  Mild aspiration risk    Diet Recommendation Dysphagia 3 (Mech soft);Thin liquid   Liquid Administration via: Cup;Straw Medication Administration: Crushed with puree Supervision: Full supervision/cueing for compensatory strategies Compensations: Minimize environmental distractions;Slow rate;Small sips/bites (use puree to aid oral transiting) Postural Changes: Seated upright at 90 degrees;Remain upright for at least 30 minutes after po intake    Other  Recommendations Oral Care Recommendations: Oral care BID   Follow up Recommendations  None    Frequency and Duration   none         Prognosis   n/a     Swallow Study   General Date of Onset: 05/06/15 HPI: 82 yo male adm to [facility] with HCAP, diagnosed with the flu.  PMH + for h/o CHF and dementia.  CXR showed bilateral infiltrates - consistent with CHF or edema.  Swallow evaluation ordered.   Type of Study: Bedside Swallow Evaluation Diet Prior to this Study: Dysphagia 3 (soft);Thin liquids Temperature Spikes Noted: Yes Respiratory Status: Nasal cannula History of Recent Intubation: No Behavior/Cognition: Alert;Confused;Distractible;Doesn't follow directions;Requires cueing Oral Cavity Assessment: Within Functional Limits Oral Care Completed by SLP: No Oral Cavity - Dentition: Edentulous Vision: Functional for self-feeding Self-Feeding Abilities: Needs assist;Needs set up Patient Positioning: Upright in bed Baseline Vocal Quality: Normal Volitional Cough: Strong Volitional Swallow: Unable to elicit  Oral/Motor/Sensory Function Overall Oral Motor/Sensory Function:  (excessive oro=facial movement, ? consistent with tardive dyskinesia? )   Ice Chips     Thin Liquid Thin Liquid: Within functional limits Presentation: Self Fed;Straw    Nectar Thick Nectar Thick Liquid: Not tested   Honey Thick Honey Thick Liquid: Not tested   Puree Puree: Within functional  limits Presentation: Self Fed   Solid   GO   Solid: Impaired Oral Phase Impairments: Reduced lingual movement/coordination;Impaired mastication Oral Phase Functional Implications: Oral holding;Prolonged oral transit Other Comments: use of applesauce effective to facilitate oral clearance        Mills Koller, MS [facility] SLP [phone]

## 2015-05-06 NOTE — Progress Notes (Signed)
PT Cancellation Note  Patient Details Name: Russell Fisher MRN: [id number] DOB: 02/18/1921   Cancelled Treatment:    Reason Eval/Treat Not Completed: Fatigue/lethargy limiting ability to participate. Caregivers requested therapy check back another day.    Rebeca Alert, MPT Pager: [phone]

## 2015-05-07 DIAGNOSIS — G9340 Encephalopathy, unspecified: Secondary | ICD-10-CM

## 2015-05-07 LAB — BASIC METABOLIC PANEL
Anion gap: 12 (ref 5–15)
BUN: 14 mg/dL (ref 6–20)
CALCIUM: 8.4 mg/dL — AB (ref 8.9–10.3)
CO2: 18 mmol/L — AB (ref 22–32)
CREATININE: 0.79 mg/dL (ref 0.61–1.24)
Chloride: 107 mmol/L (ref 101–111)
GLUCOSE: 99 mg/dL (ref 65–99)
Potassium: 3.7 mmol/L (ref 3.5–5.1)
Sodium: 137 mmol/L (ref 135–145)

## 2015-05-07 NOTE — Evaluation (Signed)
Physical Therapy Evaluation Patient Details Name: Russell Fisher MRN: [id number] DOB: 04-17-1920 Today's Date: 05/07/2015   History of Present Illness  Russell Fisher is a 82 y.o. male with past medical history of congestive heart failure with a previously known EF of ~30%, possible early dementia who lives at [facility]. He was brought into the hospital due to complaints of cough and fever, pt + for flu and pna    Clinical Impression  Pt admitted with above diagnosis. Pt currently with functional limitations due to the deficits listed below (see PT Problem List).  Pt will benefit from skilled PT to increase their independence and safety with mobility to allow discharge to the venue listed below.  Pt is extremely HOH, he is cooperative with PT/OOB and with no complaints, no family present to fully determine his baseline;  Will follow     Follow Up Recommendations Home health PT;SNF (HHPT at ALF vs SNF)    Equipment Recommendations       Recommendations for Other Services       Precautions / Restrictions Precautions Precautions: Fall      Mobility  Bed Mobility Overal bed mobility: Needs Assistance Bed Mobility: Supine to Sit     Supine to sit: Min assist;Mod assist     General bed mobility comments: assist with LEs and trunk  Transfers Overall transfer level: Needs assistance Equipment used: Rolling walker (2 wheeled) Transfers: Sit to/from [employer] Sit to Stand: Mod assist;Min assist Stand pivot transfers: Mod assist       General transfer comment: tactile cues for hand placement  Ambulation/Gait             General Gait Details: pivotal steps to chair with RW  Stairs            Wheelchair Mobility    Modified Rankin (Stroke Patients Only)       Balance Overall balance assessment: Needs assistance   Sitting balance-Leahy Scale: Fair       Standing balance-Leahy Scale: Poor                                Pertinent Vitals/Pain Pain Assessment: Faces Faces Pain Scale: No hurt    Home Living Family/patient expects to be discharged to:: Assisted living                 Additional Comments: lives at [facility]    Prior Function Level of Independence: Independent with assistive device(s)               Hand Dominance        Extremity/Trunk Assessment   Upper Extremity Assessment: Defer to OT evaluation           Lower Extremity Assessment: Generalized weakness         Communication   Communication: HOH  Cognition Arousal/Alertness: Awake/alert Behavior During Therapy: WFL for tasks assessed/performed Overall Cognitive Status: History of cognitive impairments - at baseline, difficult to determine d/t [facility]                      General Comments      Exercises        Assessment/Plan    PT Assessment Patient needs continued PT services  PT Diagnosis Difficulty walking   PT Problem List Decreased strength;Decreased activity tolerance;Decreased mobility;Decreased balance  PT Treatment Interventions DME instruction;Gait training;Functional mobility training;Therapeutic activities;Patient/family education;Therapeutic exercise  PT Goals (Current goals can be found in the Care Plan section) Acute Rehab PT Goals Patient Stated Goal: does not state PT Goal Formulation: Patient unable to participate in goal setting Time For Goal Achievement: 05/21/15 Potential to Achieve Goals: Good    Frequency Min 3X/week   Barriers to discharge        Co-evaluation               End of Session   Activity Tolerance: Patient limited by fatigue Patient left: in chair;with call bell/phone within reach (pt was not on bed alarm, did not place chair alarm)           Time: 1003-1015 PT Time Calculation (min) (ACUTE ONLY): 12 min   Charges:   PT Evaluation $PT Eval Low Complexity: 1 Procedure     PT G CodesDrucilla Chalet 06-02-15, 12:33 PM

## 2015-05-07 NOTE — Progress Notes (Signed)
Physical Therapy Discharge Patient Details Name: Russell Fisher MRN: [id number] DOB: 1920/04/21 Today's Date: 05/07/2015 Time:  -     Patient discharged from PT services secondary to .please see OT note re: that   Persons attending the patient are refusing PT at this time   Please see latest therapy progress note for current level of functioning and progress toward goals.    Progress and discharge plan discussed with patient and/or caregiver:per OT note GP     Sharen Heck PT [phone]  05/07/2015, 5:10 PM

## 2015-05-07 NOTE — Progress Notes (Signed)
CSW following for return to [facility] when medically ready. CSW has completed FL2 & will continue to follow and assist with return - anticipating possible return Monday, 2/27. CSW confirmed with Morrie Sheldon at [facility] that they would likely be able to take patient back, pending clinical review.    Lincoln Maxin, LCSW [facility] Clinical Social Worker cell #: [phone]

## 2015-05-07 NOTE — Progress Notes (Signed)
Pharmacy Antibiotic Note  Russell Fisher is a 82 y.o. male admitted on 05/05/2015 with HCAP.  Pharmacy has been consulted for Vancomycin, Aztreonam, and Levaquin dosing. Also found to have influenza A and started on tamiflu.    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    Allergies  Allergen Reactions  . Penicillins Swelling    Has patient had a PCN reaction causing immediate rash, facial/tongue/throat swelling, SOB or lightheadedness with hypotension: unknown Has patient had a PCN reaction causing severe rash involving mucus membranes or skin necrosis: unknown Has patient had a PCN reaction that required hospitalization: unknown Has patient had a PCN reaction occurring within the last 10 years: unknown If all of the above answers are "NO", then may proceed with Cephalosporin use.     Antimicrobials this admission: Vancomycin 2/22 >>  Aztreonam 2/22 >>   Cultures:  2/22 BCx: 1/2 GPC so far 2/22 UCx: NGF 2/22 MRSA PCR negative 2/22 influenza A: positive 2/22 Strep pneumo: neg 2/22 Legionella: neg  Thank you  for allowing pharmacy to be a part of this patient's care.  Haynes Hoehn, PharmD, BCPS 05/07/2015, 10:21 AM  Pager: [phone]

## 2015-05-07 NOTE — Progress Notes (Addendum)
OT Cancellation Note  Patient Details Name: Russell Fisher MRN: [id number] DOB: [birth year]   Cancelled Treatment:    Reason Eval/Treat Not Completed: Patient at procedure or test/ unavailable - Pt having catheter inserted.  Caregivers (POA) waiting in the hallway.  Explained role of OT/PT.  Caregiver states "I don't know why he has therapy, he can't do any of that"  Explained that pt was able to walk with PT earlier and that mobility reduces risks of secondary issues.  She appeared very irritated, rolled her eyes, and looked awa, and was upset that they were not alerted that pt had received PT.   They state they do no want pt to receive therapies.  Will sign off at this time.   Angelene Giovanni [place], OTR/L [phone]  05/07/2015, 2:25 PM

## 2015-05-07 NOTE — Progress Notes (Signed)
PROGRESS NOTE  Russell Fisher WJX:[id number] DOB: Nov 10, 1920 DOA: 05/05/2015 PCP: Lupita Raider, MD  Assessment/Plan: Influenza A- tamiflu 2/22 x 5 days  Pneumonia, possibly healthcare associated with sepsis. SLP: DYS 3 with thin liquids vancomycin and aztreonam, Levaquin   mild acute renal failure:  Gentle IVF   history of congestive heart failure: No echocardiogram report is available in our system. There are some records which mention that his EF is about 30-35%. He appears to be reasonably well compensated. BNP is 75, so acute CHF is unlikely. Caution with IV fluids   history of depression: Continue with his home medications  1/2 blood culture positive -prob contaminant -await speciation  PT eval, from ALF [employer])  Code Status: DNR Family Communication: MPOA at bedside 2/23 Disposition Plan:    Consultants:    Procedures:     HPI/Subjective: Much more awake today Says he is not feeling well   Objective: Filed Vitals:   05/06/15 2222 05/07/15 0401  BP: 133/60 119/70  Pulse: 68 69  Temp: 98.2 F (36.8 C) 98.9 F (37.2 C)  Resp: 20 18    Intake/Output Summary (Last 24 hours) at 05/07/15 1010 Last data filed at 05/07/15 0844  Gross per 24 hour  Intake    690 ml  Output   1225 ml  Net   -535 ml   Filed Weights   05/05/15 0622 05/07/15 0401  Weight: 70.308 kg (155 lb) 72.848 kg (160 lb 9.6 oz)    Exam:   General:  Awake, asking for friends  Cardiovascular: rrr  Respiratory: coarse breath sounds  Abdomen: +BS, soft  Musculoskeletal: no edema   Data Reviewed: Basic Metabolic Panel:  Recent Labs Lab 05/05/15 0515 05/06/15 0612 05/07/15 0913  NA 138 136 137  K 3.9 3.9 3.7  CL 104 105 107  CO2 23 19* 18*  GLUCOSE 100* 99 99  BUN 21* 16 14  CREATININE 1.27* 0.97 0.79  CALCIUM 9.0 8.2* 8.4*   Liver Function Tests:  Recent Labs Lab 05/05/15 0515 05/06/15 0612  AST 34 51*  ALT 22 27  ALKPHOS 70 62  BILITOT 0.8  0.6  PROT 7.2 6.2*  ALBUMIN 3.9 3.2*   No results for input(s): LIPASE, AMYLASE in the last 168 hours. No results for input(s): AMMONIA in the last 168 hours. CBC:  Recent Labs Lab 05/05/15 0515 05/05/15 1750 05/06/15 0612  WBC 4.3 3.4* 3.7*  NEUTROABS 3.5 2.6  --   HGB 14.1 12.1* 13.5  HCT 42.2 37.0* 41.2  MCV 91.5 92.0 92.0  PLT 159 130* 128*   Cardiac Enzymes: No results for input(s): CKTOTAL, CKMB, CKMBINDEX, TROPONINI in the last 168 hours. BNP (last 3 results)  Recent Labs  05/05/15 0515  BNP 75.7    ProBNP (last 3 results) No results for input(s): PROBNP in the last 8760 hours.  CBG: No results for input(s): GLUCAP in the last 168 hours.  Recent Results (from the past 240 hour(s))  Blood Culture (routine x 2)     Status: None (Preliminary result)   Collection Time: 05/05/15  6:15 AM  Result Value Ref Range Status   Specimen Description BLOOD LEFT ARM  Final   Special Requests BOTTLES DRAWN AEROBIC AND ANAEROBIC 5CC  Final   Culture  Setup Time   Final    GRAM POSITIVE COCCI IN CLUSTERS ANAEROBIC BOTTLE ONLY CRITICAL RESULT CALLED TO, READ BACK BY AND VERIFIED WITH: J DEUTSCH 05/06/15 @ 1529 M VESTAL    Culture  Final    GRAM POSITIVE COCCI Performed at [facility]    Report Status PENDING  Incomplete  Blood Culture (routine x 2)     Status: None (Preliminary result)   Collection Time: 05/05/15  6:20 AM  Result Value Ref Range Status   Specimen Description BLOOD RIGHT ARM  Final   Special Requests BOTTLES DRAWN AEROBIC AND ANAEROBIC 5CC  Final   Culture   Final    NO GROWTH 1 DAY Performed at [facility]    Report Status PENDING  Incomplete  Urine culture     Status: None   Collection Time: 05/05/15  6:32 AM  Result Value Ref Range Status   Specimen Description URINE, CATHETERIZED  Final   Special Requests NONE  Final   Culture   Final    NO GROWTH 1 DAY Performed at [facility]    Report Status 05/06/2015 FINAL   Final  MRSA PCR Screening     Status: None   Collection Time: 05/05/15  6:20 PM  Result Value Ref Range Status   MRSA by PCR NEGATIVE NEGATIVE Final    Comment:        The GeneXpert MRSA Assay (FDA approved for NASAL specimens only), is one component of a comprehensive MRSA colonization surveillance program. It is not intended to diagnose MRSA infection nor to guide or monitor treatment for MRSA infections.      Studies: No results found.  Scheduled Meds: . antiseptic oral rinse  7 mL Mouth Rinse q12n4p  . aztreonam  1 g Intravenous Q8H  . carbamide peroxide  5 drop Both Ears BID  . chlorhexidine  15 mL Mouth Rinse BID  . enoxaparin (LOVENOX) injection  40 mg Subcutaneous Q24H  . feeding supplement (ENSURE ENLIVE)  237 mL Oral TID BM  . finasteride  5 mg Oral Q breakfast  . levofloxacin (LEVAQUIN) IV  750 mg Intravenous Q48H  . metoCLOPramide  5 mg Oral TID AC & HS  . oseltamivir  30 mg Oral BID  . polyethylene glycol  17 g Oral Q breakfast  . saccharomyces boulardii  250 mg Oral BID  . sertraline  50 mg Oral Q breakfast  . sodium chloride flush  3 mL Intravenous Q12H  . vancomycin  1,000 mg Intravenous Q24H   Continuous Infusions:  Antibiotics Given (last 72 hours)    Date/Time Action Medication Dose Rate   05/05/15 1748 Given   levofloxacin (LEVAQUIN) IVPB 750 mg 750 mg 100 mL/hr   05/05/15 1801 Given   oseltamivir (TAMIFLU) capsule 30 mg 30 mg    05/06/15 0530 Given   vancomycin (VANCOCIN) IVPB 1000 mg/200 mL premix 1,000 mg 200 mL/hr   05/06/15 4782 Given   oseltamivir (TAMIFLU) capsule 30 mg 30 mg    05/06/15 2156 Given   oseltamivir (TAMIFLU) capsule 30 mg 30 mg    05/07/15 0525 Given   vancomycin (VANCOCIN) IVPB 1000 mg/200 mL premix 1,000 mg 200 mL/hr      Principal Problem:   Healthcare-associated pneumonia Active Problems:   Sepsis (HCC)   Acute encephalopathy   Influenza due to identified novel influenza A virus with other respiratory  manifestations    Time spent: 25 min    Rashi Granier [facility]  [facility] Pager [phone] If 7PM-7AM, please contact night-coverage at [email or web address], password [facility] 05/07/2015, 10:10 AM  LOS: 2 days

## 2015-05-07 NOTE — Progress Notes (Signed)
At 1400 NT reported to have found pt squatting on the floor near his bed with some stool on the floor.  No evidence of fall.  Pt was cleaned and returned to bed.

## 2015-05-08 LAB — CULTURE, BLOOD (ROUTINE X 2)

## 2015-05-08 MED ORDER — LEVOFLOXACIN IN D5W 750 MG/150ML IV SOLN
750.0000 mg | INTRAVENOUS | Status: DC
  Administered 2015-05-08 – 2015-05-09 (×2): 750 mg via INTRAVENOUS
  Filled 2015-05-08 (×2): qty 150

## 2015-05-08 NOTE — Progress Notes (Signed)
Pharmacy Antibiotic Note  Russell Fisher is a 82 y.o. male admitted on 05/05/2015 with HCAP.  Pharmacy has been consulted for Vancomycin, Aztreonam, and Levaquin dosing. Also found to have influenza A and started on tamiflu.    Day #4 antibiotics  2/25:  AKI improving, CrCl ~57 CG ( WBC remains low 1 of 2 blood cultures growing GPC in clusters identified as CoNS  Plan:  Suggest d/c vancomycin based on CoNS in blood and neg MRSA PCR from nares  For improved kidney function, change Levaquin  IV q24h  Monitor for worsening mental status which is common in elderly on quinolones  Aztreonam 1g IV q8h remains appropriatey dosed - consider stopping based on culture results.    Height: 5' 8.11" (173 cm) Weight: 159 lb 11.2 oz (72.439 kg) IBW/kg (Calculated) : 68.65  Temp (24hrs), Avg:99 F (37.2 C), Min:98.9 F (37.2 C), Max:99 F (37.2 C)   Recent Labs Lab 05/05/15 0515 05/05/15 0615 05/05/15 1027 05/05/15 1750 05/05/15 2107 05/06/15 0612 05/07/15 0913  WBC 4.3  --   --  3.4*  --  3.7*  --   CREATININE 1.27*  --   --   --   --  0.97 0.79  LATICACIDVEN  --  1.45 2.14* 0.9 1.0  --   --     Estimated Creatinine Clearance: 57.3 mL/min (by C-G formula based on Cr of 0.79).    Allergies  Allergen Reactions  . Penicillins Swelling    Has patient had a PCN reaction causing immediate rash, facial/tongue/throat swelling, SOB or lightheadedness with hypotension: unknown Has patient had a PCN reaction causing severe rash involving mucus membranes or skin necrosis: unknown Has patient had a PCN reaction that required hospitalization: unknown Has patient had a PCN reaction occurring within the last 10 years: unknown If all of the above answers are "NO", then may proceed with Cephalosporin use.     Antimicrobials this admission: Vancomycin 2/22 >>  Aztreonam 2/22 >>   Cultures:  2/22 BCx: 1/2 GPC - CoNS (contaminant) 2/22 UCx: NGF 2/22 MRSA PCR negative 2/22 influenza  A: positive 2/22 Strep pneumo: neg 2/22 Legionella: neg  Thank you for allowing pharmacy to be a part of this patient's care.  Juliette Alcide, PharmD, BCPS.   Pager: [phone] 05/08/2015 11:24 AM

## 2015-05-08 NOTE — Progress Notes (Signed)
PROGRESS NOTE  Russell Fisher ZOX:[id number] DOB: 1920-08-05 DOA: 05/05/2015 PCP: Lupita Raider, MD  Assessment/Plan: Influenza A- tamiflu 2/22 x 5 days  Pneumonia, possibly healthcare associated with sepsis. SLP: DYS 3 with thin liquids vancomycin and aztreonam, Levaquin- start to narrow tomm  mild acute renal failure:  Gentle IVF   history of congestive heart failure: No echocardiogram report is available in our system. There are some records which mention that his EF is about 30-35%. He appears to be reasonably well compensated. BNP is 75, so acute CHF is unlikely. Caution with IV fluids   history of depression: Continue with his home medications  1/2 blood culture positive- staph coag neg -prob contaminant   PT eval, from ALF [facility])  Code Status: DNR Family Communication: MPOA at bedside 2/23 Disposition Plan: suspect Tuesday/Wednesday   Consultants:    Procedures:     HPI/Subjective: Says he is still trying to wake up   Objective: Filed Vitals:   05/07/15 2138 05/08/15 0555  BP: 124/65 122/64  Pulse: 67 64  Temp: 98.9 F (37.2 C) 99 F (37.2 C)  Resp: 16 18    Intake/Output Summary (Last 24 hours) at 05/08/15 1305 Last data filed at 05/08/15 8119  Gross per 24 hour  Intake    350 ml  Output    400 ml  Net    -50 ml   Filed Weights   05/05/15 0622 05/07/15 0401 05/08/15 0500  Weight: 70.308 kg (155 lb) 72.848 kg (160 lb 9.6 oz) 72.439 kg (159 lb 11.2 oz)    Exam:   General:  Awake, NAD- elderly/frail appearing  Cardiovascular: rrr  Respiratory: coarse breath sounds R>L  Abdomen: +BS, soft  Musculoskeletal: no edema   Data Reviewed: Basic Metabolic Panel:  Recent Labs Lab 05/05/15 0515 05/06/15 0612 05/07/15 0913  NA 138 136 137  K 3.9 3.9 3.7  CL 104 105 107  CO2 23 19* 18*  GLUCOSE 100* 99 99  BUN 21* 16 14  CREATININE 1.27* 0.97 0.79  CALCIUM 9.0 8.2* 8.4*   Liver Function Tests:  Recent Labs Lab  05/05/15 0515 05/06/15 0612  AST 34 51*  ALT 22 27  ALKPHOS 70 62  BILITOT 0.8 0.6  PROT 7.2 6.2*  ALBUMIN 3.9 3.2*   No results for input(s): LIPASE, AMYLASE in the last 168 hours. No results for input(s): AMMONIA in the last 168 hours. CBC:  Recent Labs Lab 05/05/15 0515 05/05/15 1750 05/06/15 0612  WBC 4.3 3.4* 3.7*  NEUTROABS 3.5 2.6  --   HGB 14.1 12.1* 13.5  HCT 42.2 37.0* 41.2  MCV 91.5 92.0 92.0  PLT 159 130* 128*   Cardiac Enzymes: No results for input(s): CKTOTAL, CKMB, CKMBINDEX, TROPONINI in the last 168 hours. BNP (last 3 results)  Recent Labs  05/05/15 0515  BNP 75.7    ProBNP (last 3 results) No results for input(s): PROBNP in the last 8760 hours.  CBG: No results for input(s): GLUCAP in the last 168 hours.  Recent Results (from the past 240 hour(s))  Blood Culture (routine x 2)     Status: None   Collection Time: 05/05/15  6:15 AM  Result Value Ref Range Status   Specimen Description BLOOD LEFT ARM  Final   Special Requests BOTTLES DRAWN AEROBIC AND ANAEROBIC 5CC  Final   Culture  Setup Time   Final    GRAM POSITIVE COCCI IN CLUSTERS ANAEROBIC BOTTLE ONLY CRITICAL RESULT CALLED TO, READ BACK BY AND VERIFIED WITH:  J DEUTSCH 05/06/15 @ 1529 M VESTAL    Culture   Final    STAPHYLOCOCCUS SPECIES (COAGULASE NEGATIVE) THE SIGNIFICANCE OF ISOLATING THIS ORGANISM FROM A SINGLE SET OF BLOOD CULTURES WHEN MULTIPLE SETS ARE DRAWN IS UNCERTAIN. PLEASE NOTIFY THE MICROBIOLOGY DEPARTMENT WITHIN ONE WEEK IF SPECIATION AND SENSITIVITIES ARE REQUIRED. Performed at [facility]    Report Status 05/08/2015 FINAL  Final  Blood Culture (routine x 2)     Status: None (Preliminary result)   Collection Time: 05/05/15  6:20 AM  Result Value Ref Range Status   Specimen Description BLOOD RIGHT ARM  Final   Special Requests BOTTLES DRAWN AEROBIC AND ANAEROBIC 5CC  Final   Culture   Final    NO GROWTH 3 DAYS Performed at [facility]    Report  Status PENDING  Incomplete  Urine culture     Status: None   Collection Time: 05/05/15  6:32 AM  Result Value Ref Range Status   Specimen Description URINE, CATHETERIZED  Final   Special Requests NONE  Final   Culture   Final    NO GROWTH 1 DAY Performed at [facility]    Report Status 05/06/2015 FINAL  Final  MRSA PCR Screening     Status: None   Collection Time: 05/05/15  6:20 PM  Result Value Ref Range Status   MRSA by PCR NEGATIVE NEGATIVE Final    Comment:        The GeneXpert MRSA Assay (FDA approved for NASAL specimens only), is one component of a comprehensive MRSA colonization surveillance program. It is not intended to diagnose MRSA infection nor to guide or monitor treatment for MRSA infections.      Studies: No results found.  Scheduled Meds: . antiseptic oral rinse  7 mL Mouth Rinse q12n4p  . aztreonam  1 g Intravenous Q8H  . carbamide peroxide  5 drop Both Ears BID  . chlorhexidine  15 mL Mouth Rinse BID  . enoxaparin (LOVENOX) injection  40 mg Subcutaneous Q24H  . feeding supplement (ENSURE ENLIVE)  237 mL Oral TID BM  . finasteride  5 mg Oral Q breakfast  . levofloxacin (LEVAQUIN) IV  750 mg Intravenous Q24H  . metoCLOPramide  5 mg Oral TID AC & HS  . oseltamivir  30 mg Oral BID  . polyethylene glycol  17 g Oral Q breakfast  . saccharomyces boulardii  250 mg Oral BID  . sertraline  50 mg Oral Q breakfast  . sodium chloride flush  3 mL Intravenous Q12H  . vancomycin  1,000 mg Intravenous Q24H   Continuous Infusions:  Antibiotics Given (last 72 hours)    Date/Time Action Medication Dose Rate   05/05/15 1748 Given   levofloxacin (LEVAQUIN) IVPB 750 mg 750 mg 100 mL/hr   05/05/15 1801 Given   oseltamivir (TAMIFLU) capsule 30 mg 30 mg    05/06/15 0530 Given   vancomycin (VANCOCIN) IVPB 1000 mg/200 mL premix 1,000 mg 200 mL/hr   05/06/15 1610 Given   oseltamivir (TAMIFLU) capsule 30 mg 30 mg    05/06/15 2156 Given   oseltamivir (TAMIFLU)  capsule 30 mg 30 mg    05/07/15 0525 Given   vancomycin (VANCOCIN) IVPB 1000 mg/200 mL premix 1,000 mg 200 mL/hr   05/07/15 1039 Given   oseltamivir (TAMIFLU) capsule 30 mg 30 mg    05/07/15 1807 Given   levofloxacin (LEVAQUIN) IVPB 750 mg 750 mg 100 mL/hr   05/07/15 2111 Given   oseltamivir (  TAMIFLU) capsule 30 mg 30 mg    05/08/15 1610 Given   vancomycin (VANCOCIN) IVPB 1000 mg/200 mL premix 1,000 mg 200 mL/hr   05/08/15 0957 Given   oseltamivir (TAMIFLU) capsule 30 mg 30 mg       Principal Problem:   Healthcare-associated pneumonia Active Problems:   Sepsis (HCC)   Acute encephalopathy   Influenza due to identified novel influenza A virus with other respiratory manifestations    Time spent: 25 min    Lawerence Dery U [facility]  [facility] Pager [phone] If 7PM-7AM, please contact night-coverage at [email or web address], password [facility] 05/08/2015, 1:05 PM  LOS: 3 days

## 2015-05-09 NOTE — Progress Notes (Signed)
PROGRESS NOTE  Russell Fisher UEA:[id number] DOB: 09-21-1920 DOA: 05/05/2015 PCP: Lupita Raider, MD  Assessment/Plan: Influenza A- tamiflu 2/22 x 5 days  Pneumonia, possibly healthcare associated with sepsis. SLP: DYS 3 with thin liquids vancomycin and aztreonam, Levaquin-narrowed to just levaquin  mild acute renal failure:  Gentle IVF   history of congestive heart failure: No echocardiogram report is available in our system. There are some records which mention that his EF is about 30-35%. He appears to be reasonably well compensated. BNP is 75, so acute CHF is unlikely. Caution with IV fluids   history of depression: Continue with his home medications  1/2 blood culture positive- staph coag neg -prob contaminant   PT eval, from ALF [employer] [facility])  Code Status: DNR Family Communication: MPOA at bedside 2/26 Disposition Plan: [place]F [place]/Tuesday   Consultants:    Procedures:     HPI/Subjective: Trying to get out of bed  Objective: Filed Vitals:   05/08/15 2230 05/09/15 0532  BP: 122/71 131/64  Pulse: 61 56  Temp: 98.2 F (36.8 C) 99.1 F (37.3 C)  Resp: 18 18    Intake/Output Summary (Last 24 hours) at 05/09/15 1247 Last data filed at 05/09/15 0531  Gross per 24 hour  Intake     50 ml  Output    850 ml  Net   -800 ml   Filed Weights   05/07/15 0401 05/08/15 0500 05/09/15 0532  Weight: 72.848 kg (160 lb 9.6 oz) 72.439 kg (159 lb 11.2 oz) 71.578 kg (157 lb 12.8 oz)    Exam:   General:  Awake, NAD- elderly/frail appearing  Cardiovascular: rrr  Respiratory: coarse breath sounds R>L  Abdomen: +BS, soft  Musculoskeletal: no edema   Data Reviewed: Basic Metabolic Panel:  Recent Labs Lab 05/05/15 0515 05/06/15 0612 05/07/15 0913  NA 138 136 137  K 3.9 3.9 3.7  CL 104 105 107  CO2 23 19* 18*  GLUCOSE 100* 99 99  BUN 21* 16 14  CREATININE 1.27* 0.97 0.79  CALCIUM 9.0 8.2* 8.4*   Liver Function Tests:  Recent Labs Lab  05/05/15 0515 05/06/15 0612  AST 34 51*  ALT 22 27  ALKPHOS 70 62  BILITOT 0.8 0.6  PROT 7.2 6.2*  ALBUMIN 3.9 3.2*   No results for input(s): LIPASE, AMYLASE in the last 168 hours. No results for input(s): AMMONIA in the last 168 hours. CBC:  Recent Labs Lab 05/05/15 0515 05/05/15 1750 05/06/15 0612  WBC 4.3 3.4* 3.7*  NEUTROABS 3.5 2.6  --   HGB 14.1 12.1* 13.5  HCT 42.2 37.0* 41.2  MCV 91.5 92.0 92.0  PLT 159 130* 128*   Cardiac Enzymes: No results for input(s): CKTOTAL, CKMB, CKMBINDEX, TROPONINI in the last 168 hours. BNP (last 3 results)  Recent Labs  05/05/15 0515  BNP 75.7    ProBNP (last 3 results) No results for input(s): PROBNP in the last 8760 hours.  CBG: No results for input(s): GLUCAP in the last 168 hours.  Recent Results (from the past 240 hour(s))  Blood Culture (routine x 2)     Status: None   Collection Time: 05/05/15  6:15 AM  Result Value Ref Range Status   Specimen Description BLOOD LEFT ARM  Final   Special Requests BOTTLES DRAWN AEROBIC AND ANAEROBIC 5CC  Final   Culture  Setup Time   Final    GRAM POSITIVE COCCI IN CLUSTERS ANAEROBIC BOTTLE ONLY CRITICAL RESULT CALLED TO, READ BACK BY AND VERIFIED WITH: J DEUTSCH  05/06/15 @ 1529 M VESTAL    Culture   Final    STAPHYLOCOCCUS SPECIES (COAGULASE NEGATIVE) THE SIGNIFICANCE OF ISOLATING THIS ORGANISM FROM A SINGLE SET OF BLOOD CULTURES WHEN MULTIPLE SETS ARE DRAWN IS UNCERTAIN. PLEASE NOTIFY THE MICROBIOLOGY DEPARTMENT WITHIN ONE WEEK IF SPECIATION AND SENSITIVITIES ARE REQUIRED. Performed at [facility]    Report Status 05/08/2015 FINAL  Final  Blood Culture (routine x 2)     Status: None (Preliminary result)   Collection Time: 05/05/15  6:20 AM  Result Value Ref Range Status   Specimen Description BLOOD RIGHT ARM  Final   Special Requests BOTTLES DRAWN AEROBIC AND ANAEROBIC 5CC  Final   Culture   Final    NO GROWTH 3 DAYS Performed at [facility]    Report  Status PENDING  Incomplete  Urine culture     Status: None   Collection Time: 05/05/15  6:32 AM  Result Value Ref Range Status   Specimen Description URINE, CATHETERIZED  Final   Special Requests NONE  Final   Culture   Final    NO GROWTH 1 DAY Performed at [facility]    Report Status 05/06/2015 FINAL  Final  MRSA PCR Screening     Status: None   Collection Time: 05/05/15  6:20 PM  Result Value Ref Range Status   MRSA by PCR NEGATIVE NEGATIVE Final    Comment:        The GeneXpert MRSA Assay (FDA approved for NASAL specimens only), is one component of a comprehensive MRSA colonization surveillance program. It is not intended to diagnose MRSA infection nor to guide or monitor treatment for MRSA infections.      Studies: No results found.  Scheduled Meds: . antiseptic oral rinse  7 mL Mouth Rinse q12n4p  . aztreonam  1 g Intravenous Q8H  . carbamide peroxide  5 drop Both Ears BID  . chlorhexidine  15 mL Mouth Rinse BID  . enoxaparin (LOVENOX) injection  40 mg Subcutaneous Q24H  . feeding supplement (ENSURE ENLIVE)  237 mL Oral TID BM  . finasteride  5 mg Oral Q breakfast  . levofloxacin (LEVAQUIN) IV  750 mg Intravenous Q24H  . metoCLOPramide  5 mg Oral TID AC & HS  . oseltamivir  30 mg Oral BID  . polyethylene glycol  17 g Oral Q breakfast  . saccharomyces boulardii  250 mg Oral BID  . sertraline  50 mg Oral Q breakfast  . sodium chloride flush  3 mL Intravenous Q12H  . vancomycin  1,000 mg Intravenous Q24H   Continuous Infusions:  Antibiotics Given (last 72 hours)    Date/Time Action Medication Dose Rate   05/06/15 2156 Given   oseltamivir (TAMIFLU) capsule 30 mg 30 mg    05/07/15 0525 Given   vancomycin (VANCOCIN) IVPB 1000 mg/200 mL premix 1,000 mg 200 mL/hr   05/07/15 1039 Given   oseltamivir (TAMIFLU) capsule 30 mg 30 mg    05/07/15 1807 Given   levofloxacin (LEVAQUIN) IVPB 750 mg 750 mg 100 mL/hr   05/07/15 2111 Given   oseltamivir (TAMIFLU)  capsule 30 mg 30 mg    05/08/15 0514 Given   vancomycin (VANCOCIN) IVPB 1000 mg/200 mL premix 1,000 mg 200 mL/hr   05/08/15 0957 Given   oseltamivir (TAMIFLU) capsule 30 mg 30 mg    05/08/15 1738 Given   levofloxacin (LEVAQUIN) IVPB 750 mg 750 mg 100 mL/hr   05/08/15 2210 Given   oseltamivir (TAMIFLU) capsule  30 mg 30 mg    05/09/15 0531 Given   vancomycin (VANCOCIN) IVPB 1000 mg/200 mL premix 1,000 mg 200 mL/hr   05/09/15 1113 Given   oseltamivir (TAMIFLU) capsule 30 mg 30 mg       Principal Problem:   Healthcare-associated pneumonia Active Problems:   Sepsis (HCC)   Acute encephalopathy   Influenza due to identified novel influenza A virus with other respiratory manifestations    Time spent: 25 min    Asyia Hornung U [facility]  [facility] Pager [phone] If 7PM-7AM, please contact night-coverage at [email or web address], password [facility] 05/09/2015, 12:47 PM  LOS: 4 days

## 2015-05-10 ENCOUNTER — Emergency Department (HOSPITAL_COMMUNITY)
Admission: EM | Admit: 2015-05-10 | Discharge: 2015-05-10 | Disposition: A | Discharge status: Home / Self Care | Payer: Medicare Other

## 2015-05-10 LAB — CULTURE, BLOOD (ROUTINE X 2): CULTURE: NO GROWTH

## 2015-05-10 MED ORDER — ENSURE ENLIVE PO LIQD: 237.0000 mL | Freq: Three times a day (TID) | ORAL | Status: DC

## 2015-05-10 MED ORDER — LEVOFLOXACIN 750 MG PO TABS: 750.0000 mg | ORAL_TABLET | Freq: Every day | ORAL | Status: DC

## 2015-05-10 MED ORDER — SACCHAROMYCES BOULARDII 250 MG PO CAPS: 250.0000 mg | ORAL_CAPSULE | Freq: Two times a day (BID) | ORAL | Status: AC

## 2015-05-10 MED ORDER — METOCLOPRAMIDE HCL 5 MG PO TABS: 5.0000 mg | ORAL_TABLET | Freq: Three times a day (TID) | ORAL | Status: AC

## 2015-05-10 NOTE — Discharge Summary (Addendum)
Physician Discharge Summary  Russell Fisher ZOX:[id number] DOB: 09-Jul-1920 DOA: 05/05/2015  PCP: Lupita Raider, MD  Admit date: 05/05/2015 Discharge date: 05/10/2015  Time spent: 35 minutes  Recommendations for Outpatient Follow-up:  levaquin for 2 more doses-- 3/1 florastar- until 3/2  Discharge Diagnoses:  Principal Problem:   Healthcare-associated pneumonia Active Problems:   Sepsis (HCC)   Acute encephalopathy   Influenza due to identified novel influenza A virus with other respiratory manifestations   Discharge Condition: improved  Diet recommendation: DYS 3 thin  Filed Weights   05/07/15 0401 05/08/15 0500 05/09/15 0532  Weight: 72.848 kg (160 lb 9.6 oz) 72.439 kg (159 lb 11.2 oz) 71.578 kg (157 lb 12.8 oz)    History of present illness:  Russell Fisher is a 82 y.o. male with past medical history of congestive heart failure with a previously known Ef of ~30%, possible early dementia who lives at [facility]. He was brought into the hospital due to complaints of cough and fever. Patient is accompanied by his friends who are the power of attorney. Patient does not have any family. He has hearing impairment and has cognitive impairment and so is unable to provide any history. As far as we know there's been no nausea, vomiting. He's just had a cough for the past day or 2 with congestion in the chest. He had a fever today, which prompted him to come into the hospital. Unknown if there is been other members in the facility who have been sick. Immunization status is unknown. History is very limited.   In the emergency department he was found to have pulmonary infiltrates which are most likely infectious in nature. He was found to be hypotensive. Blood pressure improved with IV fluids. He will be brought into the hospital for further management.  Hospital Course:  Influenza A- tamiflu 2/22 x 5 days- treated  Pneumonia, possibly healthcare associated with sepsis. SLP: DYS 3  with thin liquids vancomycin and aztreonam, Levaquin-narrowed to just levaquin  mild acute renal failure:  resolved  history of congestive heart failure: No echocardiogram report is available in our system. There are some records which mention that his EF is about 30-35%. He appears to be reasonably well compensated. BNP is 75, so acute CHF is unlikely.   history of depression: Continue with his home medications  1/2 blood culture positive- staph coag neg -contaminant  Procedures:    Consultations:    Discharge Exam: Filed Vitals:   05/09/15 2242 05/10/15 0522  BP: 105/64 131/64  Pulse: 68 78  Temp: 98.6 F (37 C) 97.9 F (36.6 C)  Resp: 18 18    General: pleasantly confused  Discharge Instructions   Discharge Instructions    Discharge instructions    Complete by:  As directed   DYS 3 thin DNR     Increase activity slowly    Complete by:  As directed           Current Discharge Medication List    START taking these medications   Details  feeding supplement, ENSURE ENLIVE, (ENSURE ENLIVE) LIQD Take 237 mLs by mouth 3 (three) times daily between meals. Qty: 237 mL, Refills: 12    levofloxacin (LEVAQUIN) 750 MG tablet Take 1 tablet (750 mg total) by mouth daily at 6 PM. Qty: 2 tablet, Refills: 0    saccharomyces boulardii (FLORASTOR) 250 MG capsule Take 1 capsule (250 mg total) by mouth 2 (two) times daily. Qty: 6 capsule, Refills: 0  CONTINUE these medications which have CHANGED   Details  metoCLOPramide (REGLAN) 5 MG tablet Take 1 tablet (5 mg total) by mouth 3 (three) times daily. Qty: 120 tablet, Refills: 0      CONTINUE these medications which have NOT CHANGED   Details  acetaminophen (TYLENOL) 500 MG tablet Take 500 mg by mouth every 6 (six) hours as needed for mild pain, fever or headache.    alum & mag hydroxide-simeth (MAALOX/MYLANTA) 200-200-20 MG/5ML suspension Take 30 mLs by mouth as needed for indigestion or heartburn.      Carboxymeth-Glycerin-Polysorb (REFRESH OPTIVE ADVANCED) 0.5-1-0.5 % SOLN Place 1 drop into both eyes 2 (two) times daily as needed (for dry eyes).    finasteride (PROSCAR) 5 MG tablet Take 5 mg by mouth daily with breakfast.     guaifenesin (ROBITUSSIN) 100 MG/5ML syrup Take 200 mg by mouth every 6 (six) hours as needed for cough.     LORazepam (ATIVAN) 0.5 MG tablet Take 0.5 mg by mouth 2 (two) times daily as needed for anxiety.    magnesium hydroxide (MILK OF MAGNESIA) 400 MG/5ML suspension Take 30 mLs by mouth at bedtime as needed for mild constipation.    mirtazapine (REMERON) 15 MG tablet Take 45 mg by mouth at bedtime.    Multiple Vitamin (THERA/BETA-CAROTENE) TABS Take 1 tablet by mouth daily.    Multiple Vitamins-Minerals (I-VITE) TABS Take 1 tablet by mouth daily with breakfast.    neomycin-bacitracin-polymyxin (NEOSPORIN) [phone] ointment Apply 1 application topically as needed (small abraisions or irritations).    polyethylene glycol (MIRALAX / GLYCOLAX) packet Take 17 g by mouth daily with breakfast.     sertraline (ZOLOFT) 50 MG tablet Take 50 mg by mouth daily with breakfast.     traMADol (ULTRAM) 50 MG tablet Take 1 tablet (50 mg total) by mouth every 6 (six) hours as needed. Qty: 15 tablet, Refills: 0      STOP taking these medications     loperamide (IMODIUM) 2 MG capsule        Allergies  Allergen Reactions  . Penicillins Swelling    Has patient had a PCN reaction causing immediate rash, facial/tongue/throat swelling, SOB or lightheadedness with hypotension: unknown Has patient had a PCN reaction causing severe rash involving mucus membranes or skin necrosis: unknown Has patient had a PCN reaction that required hospitalization: unknown Has patient had a PCN reaction occurring within the last 10 years: unknown If all of the above answers are "NO", then may proceed with Cephalosporin use.    Follow-up Information    Follow up with SHAW,KIMBERLEE, MD In  1 week.   Specialty:  Family Medicine   Contact information:   [address] [place] [address] [phone]        The results of significant diagnostics from this hospitalization (including imaging, microbiology, ancillary and laboratory) are listed below for reference.    Significant Diagnostic Studies: Dg Chest Port 1 View  05/05/2015  CLINICAL DATA:  Increased weakness and fever over last few days at skilled nursing facility question pneumonia, history CHF, bronchiectasis, valvular heart disease EXAM: PORTABLE CHEST 1 VIEW COMPARISON:  Portable exam 0000 hours compared to 02/23/2015 FINDINGS: Enlargement cardiac silhouette. Calcified tortuous thoracic aorta. Increased interstitial infiltrates bilaterally since previous exam favor pulmonary edema over infection. RIGHT basilar atelectasis. No pleural effusion or pneumothorax. Bones demineralized. IMPRESSION: Enlargement of cardiac silhouette with increased BILATERAL pulmonary infiltrates favoring pulmonary edema and CHF. RIGHT basilar atelectasis. Electronically Signed   By: Loraine Leriche  Tyron Russell M.D.   On: 05/05/2015 08:20    Microbiology: Recent Results (from the past 240 hour(s))  Blood Culture (routine x 2)     Status: None   Collection Time: 05/05/15  6:15 AM  Result Value Ref Range Status   Specimen Description BLOOD LEFT ARM  Final   Special Requests BOTTLES DRAWN AEROBIC AND ANAEROBIC 5CC  Final   Culture  Setup Time   Final    GRAM POSITIVE COCCI IN CLUSTERS ANAEROBIC BOTTLE ONLY CRITICAL RESULT CALLED TO, READ BACK BY AND VERIFIED WITH: J DEUTSCH 05/06/15 @ 1529 M VESTAL    Culture   Final    STAPHYLOCOCCUS SPECIES (COAGULASE NEGATIVE) THE SIGNIFICANCE OF ISOLATING THIS ORGANISM FROM A SINGLE SET OF BLOOD CULTURES WHEN MULTIPLE SETS ARE DRAWN IS UNCERTAIN. PLEASE NOTIFY THE MICROBIOLOGY DEPARTMENT WITHIN ONE WEEK IF SPECIATION AND SENSITIVITIES ARE REQUIRED. Performed at [facility]    Report Status  05/08/2015 FINAL  Final  Blood Culture (routine x 2)     Status: None (Preliminary result)   Collection Time: 05/05/15  6:20 AM  Result Value Ref Range Status   Specimen Description BLOOD RIGHT ARM  Final   Special Requests BOTTLES DRAWN AEROBIC AND ANAEROBIC 5CC  Final   Culture   Final    NO GROWTH 4 DAYS Performed at [facility]    Report Status PENDING  Incomplete  Urine culture     Status: None   Collection Time: 05/05/15  6:32 AM  Result Value Ref Range Status   Specimen Description URINE, CATHETERIZED  Final   Special Requests NONE  Final   Culture   Final    NO GROWTH 1 DAY Performed at [facility]    Report Status 05/06/2015 FINAL  Final  MRSA PCR Screening     Status: None   Collection Time: 05/05/15  6:20 PM  Result Value Ref Range Status   MRSA by PCR NEGATIVE NEGATIVE Final    Comment:        The GeneXpert MRSA Assay (FDA approved for NASAL specimens only), is one component of a comprehensive MRSA colonization surveillance program. It is not intended to diagnose MRSA infection nor to guide or monitor treatment for MRSA infections.      Labs: Basic Metabolic Panel:  Recent Labs Lab 05/05/15 0515 05/06/15 0612 05/07/15 0913  NA 138 136 137  K 3.9 3.9 3.7  CL 104 105 107  CO2 23 19* 18*  GLUCOSE 100* 99 99  BUN 21* 16 14  CREATININE 1.27* 0.97 0.79  CALCIUM 9.0 8.2* 8.4*   Liver Function Tests:  Recent Labs Lab 05/05/15 0515 05/06/15 0612  AST 34 51*  ALT 22 27  ALKPHOS 70 62  BILITOT 0.8 0.6  PROT 7.2 6.2*  ALBUMIN 3.9 3.2*   No results for input(s): LIPASE, AMYLASE in the last 168 hours. No results for input(s): AMMONIA in the last 168 hours. CBC:  Recent Labs Lab 05/05/15 0515 05/05/15 1750 05/06/15 0612  WBC 4.3 3.4* 3.7*  NEUTROABS 3.5 2.6  --   HGB 14.1 12.1* 13.5  HCT 42.2 37.0* 41.2  MCV 91.5 92.0 92.0  PLT 159 130* 128*   Cardiac Enzymes: No results for input(s): CKTOTAL, CKMB, CKMBINDEX, TROPONINI  in the last 168 hours. BNP: BNP (last 3 results)  Recent Labs  05/05/15 0515  BNP 75.7    ProBNP (last 3 results) No results for input(s): PROBNP in the last 8760 hours.  CBG: No results  for input(s): GLUCAP in the last 168 hours.     Signed:  Joseph Art DO.  [facility] 05/10/2015, 9:40 AM

## 2015-05-10 NOTE — Progress Notes (Signed)
Patient is set to discharge to [facility] today. CSW confirmed with [facility] that they are able to take patient back today. Patient & family at bedside aware. Discharge packet given to RN, Amil Amen. [facility] called for transport.     Lincoln Maxin, LCSW [facility] Clinical Social Worker cell #: [phone]

## 2015-05-10 NOTE — Progress Notes (Signed)
Spoke with Franchot Heidelberg [phone] at [facility] concerning pt discharged/return.  Brook asked for end dates for Levaquin and Florastor. MD placed this information discharge summary. Brook also states pt can return if the information is placed on the chart.

## 2015-05-10 NOTE — [place] FL2 (Signed)
[place] MEDICAID FL2 LEVEL OF CARE SCREENING TOOL     IDENTIFICATION  Patient Name: Russell Fisher Birthdate: 1920-05-17 Sex: male Admission Date (Current Location): 05/05/2015  [facility] and [place] Number:  Producer, television/film/video and Address:  [facility],  [address], [place] [address]      Provider Number: [id number]  Attending Physician Name and Address:  Joseph Art, DO  Relative Name and Phone Number:       Current Level of Care: Hospital Recommended Level of Care: Assisted Living Facility Prior Approval Number:    Date Approved/Denied:   PASRR Number: [id number]  Discharge Plan:      Current Diagnoses: Patient Active Problem List   Diagnosis Date Noted  . Influenza due to identified novel influenza A virus with other respiratory manifestations 05/06/2015  . Healthcare-associated pneumonia 05/05/2015  . Sepsis (HCC) 05/05/2015  . Acute encephalopathy 05/05/2015  . Altered mental status 07/07/2011  . Vomiting 07/07/2011  . Fall at home 05/15/2011  . Hypoglycemia 05/15/2011  . CHF (congestive heart failure) (HCC) 05/15/2011  . Contusion 05/15/2011  . DJD (degenerative joint disease) 05/15/2011    Orientation RESPIRATION BLADDER Height & Weight     Self  Normal Continent Weight: 157 lb 12.8 oz (71.578 kg) Height:  5' 8.11" (173 cm)  BEHAVIORAL SYMPTOMS/MOOD NEUROLOGICAL BOWEL NUTRITION STATUS      Continent Diet (DYS 3 thin)  AMBULATORY STATUS COMMUNICATION OF NEEDS Skin   Limited Assist Verbally Normal                       Personal Care Assistance Level of Assistance  Bathing, Dressing Bathing Assistance: Limited assistance   Dressing Assistance: Limited assistance     Functional Limitations Info             SPECIAL CARE FACTORS FREQUENCY  PT (By licensed PT), OT (By licensed OT)                    Contractures      Additional Factors Info  Code Status, Allergies Code Status Info: DNR Allergies  Info: Penicillins           Current Medications (05/10/2015):  This is the current hospital active medication list Current Facility-Administered Medications  Medication Dose Route Frequency Provider Last Rate Last Dose  . acetaminophen (TYLENOL) tablet 650 mg  650 mg Oral Q6H PRN Osvaldo Shipper, MD       Or  . acetaminophen (TYLENOL) suppository 650 mg  650 mg Rectal Q6H PRN Osvaldo Shipper, MD      . albuterol (PROVENTIL) (2.5 MG/3ML) 0.083% nebulizer solution 2.5 mg  2.5 mg Nebulization Q2H PRN Osvaldo Shipper, MD      . antiseptic oral rinse (CPC / CETYLPYRIDINIUM CHLORIDE 0.05%) solution 7 mL  7 mL Mouth Rinse q12n4p Osvaldo Shipper, MD   7 mL at 05/09/15 1803  . carbamide peroxide (DEBROX) 6.5 % otic solution 5 drop  5 drop Both Ears BID Osvaldo Shipper, MD   5 drop at 05/10/15 1023  . chlorhexidine (PERIDEX) 0.12 % solution 15 mL  15 mL Mouth Rinse BID Osvaldo Shipper, MD   15 mL at 05/10/15 1308  . enoxaparin (LOVENOX) injection 40 mg  40 mg Subcutaneous Q24H Osvaldo Shipper, MD   40 mg at 05/09/15 2058  . feeding supplement (ENSURE ENLIVE) (ENSURE ENLIVE) liquid 237 mL  237 mL Oral TID BM Jessica U Vann, DO   237 mL at  05/10/15 1023  . finasteride (PROSCAR) tablet 5 mg  5 mg Oral Q breakfast Osvaldo Shipper, MD   5 mg at 05/10/15 0811  . guaifenesin (ROBITUSSIN) 100 MG/5ML syrup 200 mg  200 mg Oral Q6H PRN Osvaldo Shipper, MD      . levofloxacin (LEVAQUIN) tablet 750 mg  750 mg Oral q1800 Joseph Art, DO      . LORazepam (ATIVAN) tablet 0.5 mg  0.5 mg Oral BID PRN Osvaldo Shipper, MD   0.5 mg at 05/09/15 2102  . magnesium hydroxide (MILK OF MAGNESIA) suspension 30 mL  30 mL Oral QHS PRN Osvaldo Shipper, MD      . metoCLOPramide (REGLAN) tablet 5 mg  5 mg Oral TID AC & HS Osvaldo Shipper, MD   5 mg at 05/10/15 1259  . ondansetron (ZOFRAN) tablet 4 mg  4 mg Oral Q6H PRN Osvaldo Shipper, MD       Or  . ondansetron (ZOFRAN) injection 4 mg  4 mg Intravenous Q6H PRN Osvaldo Shipper, MD      .  polyethylene glycol (MIRALAX / GLYCOLAX) packet 17 g  17 g Oral Q breakfast Osvaldo Shipper, MD   17 g at 05/09/15 1121  . saccharomyces boulardii (FLORASTOR) capsule 250 mg  250 mg Oral BID Osvaldo Shipper, MD   250 mg at 05/10/15 1023  . sertraline (ZOLOFT) tablet 50 mg  50 mg Oral Q breakfast Osvaldo Shipper, MD   50 mg at 05/10/15 0811  . sodium chloride flush (NS) 0.9 % injection 3 mL  3 mL Intravenous Q12H Osvaldo Shipper, MD   3 mL at 05/08/15 2217     Discharge Medications: Please see discharge summary for a list of discharge medications.  Relevant Imaging Results:  Relevant Lab Results:   Additional Information SSN: [id number]  Arlyss Repress, LCSW

## 2015-05-15 ENCOUNTER — Emergency Department (HOSPITAL_COMMUNITY): Payer: Medicare Other

## 2015-05-15 ENCOUNTER — Encounter (HOSPITAL_COMMUNITY): Payer: Self-pay | Admitting: Emergency Medicine

## 2015-05-15 ENCOUNTER — Emergency Department (HOSPITAL_COMMUNITY)
Admission: EM | Admit: 2015-05-15 | Discharge: 2015-05-15 | Disposition: A | Discharge status: Home / Self Care | Payer: Medicare Other | Attending: Emergency Medicine | Admitting: Emergency Medicine

## 2015-05-15 DIAGNOSIS — Z880 Allergy status to penicillin: Secondary | ICD-10-CM | POA: Insufficient documentation

## 2015-05-15 DIAGNOSIS — W19XXXA Unspecified fall, initial encounter: Secondary | ICD-10-CM

## 2015-05-15 DIAGNOSIS — Y9289 Other specified places as the place of occurrence of the external cause: Secondary | ICD-10-CM | POA: Diagnosis not present

## 2015-05-15 DIAGNOSIS — G4700 Insomnia, unspecified: Secondary | ICD-10-CM | POA: Insufficient documentation

## 2015-05-15 DIAGNOSIS — Z79899 Other long term (current) drug therapy: Secondary | ICD-10-CM | POA: Insufficient documentation

## 2015-05-15 DIAGNOSIS — F419 Anxiety disorder, unspecified: Secondary | ICD-10-CM | POA: Diagnosis not present

## 2015-05-15 DIAGNOSIS — W1839XA Other fall on same level, initial encounter: Secondary | ICD-10-CM | POA: Diagnosis not present

## 2015-05-15 DIAGNOSIS — Z8719 Personal history of other diseases of the digestive system: Secondary | ICD-10-CM | POA: Diagnosis not present

## 2015-05-15 DIAGNOSIS — F0390 Unspecified dementia without behavioral disturbance: Secondary | ICD-10-CM | POA: Insufficient documentation

## 2015-05-15 DIAGNOSIS — Z8709 Personal history of other diseases of the respiratory system: Secondary | ICD-10-CM | POA: Diagnosis not present

## 2015-05-15 DIAGNOSIS — Z043 Encounter for examination and observation following other accident: Secondary | ICD-10-CM | POA: Diagnosis not present

## 2015-05-15 DIAGNOSIS — F329 Major depressive disorder, single episode, unspecified: Secondary | ICD-10-CM | POA: Diagnosis not present

## 2015-05-15 DIAGNOSIS — Y9389 Activity, other specified: Secondary | ICD-10-CM | POA: Insufficient documentation

## 2015-05-15 DIAGNOSIS — Z792 Long term (current) use of antibiotics: Secondary | ICD-10-CM | POA: Insufficient documentation

## 2015-05-15 DIAGNOSIS — Y998 Other external cause status: Secondary | ICD-10-CM | POA: Insufficient documentation

## 2015-05-15 DIAGNOSIS — M1990 Unspecified osteoarthritis, unspecified site: Secondary | ICD-10-CM | POA: Insufficient documentation

## 2015-05-15 DIAGNOSIS — I5020 Unspecified systolic (congestive) heart failure: Secondary | ICD-10-CM | POA: Diagnosis not present

## 2015-05-15 LAB — COMPREHENSIVE METABOLIC PANEL
ALT: 57 U/L (ref 17–63)
AST: 39 U/L (ref 15–41)
Albumin: 3.6 g/dL (ref 3.5–5.0)
Alkaline Phosphatase: 65 U/L (ref 38–126)
Anion gap: 8 (ref 5–15)
BUN: 32 mg/dL — ABNORMAL HIGH (ref 6–20)
CO2: 25 mmol/L (ref 22–32)
Calcium: 8.9 mg/dL (ref 8.9–10.3)
Chloride: 106 mmol/L (ref 101–111)
Creatinine, Ser: 1.13 mg/dL (ref 0.61–1.24)
GFR calc Af Amer: >60 mL/min (ref >60)
GFR calc non Af Amer: 54 mL/min — ABNORMAL LOW (ref >60)
Glucose, Bld: 115 mg/dL — ABNORMAL HIGH (ref 65–99)
Potassium: 4.7 mmol/L (ref 3.5–5.1)
Sodium: 139 mmol/L (ref 135–145)
Total Bilirubin: 0.9 mg/dL (ref 0.3–1.2)
Total Protein: 6.9 g/dL (ref 6.5–8.1)

## 2015-05-15 LAB — CBC WITH DIFFERENTIAL/PLATELET
BASOS ABS: 0 10*3/uL (ref 0.0–0.1)
Basophils Relative: 1 %
EOS PCT: 3 %
Eosinophils Absolute: 0.1 10*3/uL (ref 0.0–0.7)
HCT: 44.1 % (ref 39.0–52.0)
Hemoglobin: 15 g/dL (ref 13.0–17.0)
LYMPHS ABS: 1.3 10*3/uL (ref 0.7–4.0)
LYMPHS PCT: 23 %
MCH: 30.9 pg (ref 26.0–34.0)
MCHC: 34 g/dL (ref 30.0–36.0)
MCV: 90.7 fL (ref 78.0–100.0)
MONO ABS: 0.8 10*3/uL (ref 0.1–1.0)
Monocytes Relative: 15 %
NEUTROS ABS: 3.3 10*3/uL (ref 1.7–7.7)
Neutrophils Relative %: 58 %
PLATELETS: 237 10*3/uL (ref 150–400)
RBC: 4.86 MIL/uL (ref 4.22–5.81)
RDW: 14.4 % (ref 11.5–15.5)
WBC: 5.5 10*3/uL (ref 4.0–10.5)

## 2015-05-15 LAB — URINALYSIS, ROUTINE W REFLEX MICROSCOPIC
Bilirubin Urine: NEGATIVE
Glucose, UA: NEGATIVE mg/dL
Hgb urine dipstick: NEGATIVE
KETONES UR: NEGATIVE mg/dL
LEUKOCYTES UA: NEGATIVE
NITRITE: NEGATIVE
PROTEIN: 30 mg/dL — AB
Specific Gravity, Urine: 1.03 (ref 1.005–1.030)
pH: 5.5 (ref 5.0–8.0)

## 2015-05-15 LAB — URINE MICROSCOPIC-ADD ON
RBC / HPF: NONE SEEN RBC/hpf (ref 0–5)
Squamous Epithelial / LPF: NONE SEEN

## 2015-05-15 LAB — I-STAT TROPONIN, ED: TROPONIN I, POC: 0 ng/mL (ref 0.00–0.08)

## 2015-05-15 LAB — CK: Total CK: 89 U/L (ref 49–397)

## 2015-05-15 LAB — I-STAT CG4 LACTIC ACID, ED
LACTIC ACID, VENOUS: 0.93 mmol/L (ref 0.5–2.0)
Lactic Acid, Venous: 1.48 mmol/L (ref 0.5–2.0)

## 2015-05-15 MED ORDER — SODIUM CHLORIDE 0.9 % IV BOLUS (SEPSIS)
1000.0000 mL | Freq: Once | INTRAVENOUS | Status: AC
  Administered 2015-05-15: 1000 mL via INTRAVENOUS

## 2015-05-15 NOTE — ED Notes (Signed)
[facility] but no answer the phone.

## 2015-05-15 NOTE — ED Provider Notes (Signed)
CSN: [id number]     Arrival date & time 05/15/15  1123 History   First MD Initiated Contact with Patient 05/15/15 1136     Chief Complaint  Patient presents with  . Fall     (Consider location/radiation/quality/duration/timing/severity/associated sxs/prior Treatment) HPI   Russell Fisher is a(n) 82 y.o. male who presents to the ED brought in by EMS for fall. He has dementia and there is a level 5 caveat. The patient was found hanging  From his bed with his head down for an unknown amount of time. He was sent to the emergency department for evaluation. He did not hit his head. He has not had any recent fevers. Patient was recently discharged for healthcare acquired pneumonia, sepsis.   Past Medical History  Diagnosis Date  . DJD (degenerative joint disease)   . Anxiety   . Depression   . Insomnia   . Duodenal ulcer   . Bronchiectasis     ct chest 05/09/10  . Systolic heart failure     with ejection fracture of 30%  . Aortic regurgitation     mild to moderate  . Tricuspid regurgitation     mild to moderate  . CHF (congestive heart failure) [facility](HCC)    Past Surgical History  Procedure Laterality Date  . Left hydrocele    . Hernia repair    . Hemorrhoid surgery    . Total knee arthroplasty      right  . Appendectomy    . Bilateral cataract repair     Family History  Problem Relation Age of Onset  . Heart disease Mother    Social History  Substance Use Topics  . Smoking status: Never Smoker   . Smokeless tobacco: Never Used  . Alcohol Use: No    Review of Systems  Unable to review systems due to dementia  Allergies  Penicillins  Home Medications   Prior to Admission medications   Medication Sig Start Date End Date Taking? Authorizing Provider  acetaminophen (TYLENOL) 500 MG tablet Take 500 mg by mouth every 6 (six) hours as needed for mild pain, fever or headache.    Historical Provider, MD  alum & mag hydroxide-simeth (MAALOX/MYLANTA) 200-200-20 MG/5ML  suspension Take 30 mLs by mouth as needed for indigestion or heartburn.     Historical Provider, MD  Carboxymeth-Glycerin-Polysorb (REFRESH OPTIVE ADVANCED) 0.5-1-0.5 % SOLN Place 1 drop into both eyes 2 (two) times daily as needed (for dry eyes).    Historical Provider, MD  feeding supplement, ENSURE ENLIVE, (ENSURE ENLIVE) LIQD Take 237 mLs by mouth 3 (three) times daily between meals. 05/10/15   Joseph ArtJessica U Vann, DO  finasteride (PROSCAR) 5 MG tablet Take 5 mg by mouth daily with breakfast.     Historical Provider, MD  guaifenesin (ROBITUSSIN) 100 MG/5ML syrup Take 200 mg by mouth every 6 (six) hours as needed for cough.     Historical Provider, MD  levofloxacin (LEVAQUIN) 750 MG tablet Take 1 tablet (750 mg total) by mouth daily at 6 PM. 05/10/15   Joseph ArtJessica U Vann, DO  LORazepam (ATIVAN) 0.5 MG tablet Take 0.5 mg by mouth 2 (two) times daily as needed for anxiety.    Historical Provider, MD  magnesium hydroxide (MILK OF MAGNESIA) 400 MG/5ML suspension Take 30 mLs by mouth at bedtime as needed for mild constipation.    Historical Provider, MD  metoCLOPramide (REGLAN) 5 MG tablet Take 1 tablet (5 mg total) by mouth 3 (three) times daily. 05/10/15 02/26/19  Joseph Art, DO  mirtazapine (REMERON) 15 MG tablet Take 45 mg by mouth at bedtime.    Historical Provider, MD  Multiple Vitamin (THERA/BETA-CAROTENE) TABS Take 1 tablet by mouth daily.    Historical Provider, MD  Multiple Vitamins-Minerals (I-VITE) TABS Take 1 tablet by mouth daily with breakfast.    Historical Provider, MD  neomycin-bacitracin-polymyxin (NEOSPORIN) [phone] ointment Apply 1 application topically as needed (small abraisions or irritations).    Historical Provider, MD  polyethylene glycol (MIRALAX / GLYCOLAX) packet Take 17 g by mouth daily with breakfast.     Historical Provider, MD  saccharomyces boulardii (FLORASTOR) 250 MG capsule Take 1 capsule (250 mg total) by mouth 2 (two) times daily. 05/10/15   Joseph Art, DO   sertraline (ZOLOFT) 50 MG tablet Take 50 mg by mouth daily with breakfast.     Historical Provider, MD  traMADol (ULTRAM) 50 MG tablet Take 1 tablet (50 mg total) by mouth every 6 (six) hours as needed. 02/24/15   Christopher Lawyer, PA-C   BP 105/66 mmHg  Pulse 63  Resp 18  SpO2 98% Physical Exam  Constitutional: He appears well-developed and well-nourished. No distress.  HENT:  Head: Normocephalic and atraumatic.  Patient is in c-collar  Eyes: Conjunctivae and EOM are normal. Pupils are equal, round, and reactive to light. No scleral icterus.  Neck: Normal range of motion. Neck supple.  Cardiovascular: Normal rate, regular rhythm and normal heart sounds.   Pulmonary/Chest: Effort normal and breath sounds normal. No respiratory distress.  Abdominal: Soft. There is no tenderness.  Neurological: He is alert.  Skin: Skin is warm and dry. He is not diaphoretic.  Psychiatric: His behavior is normal.  Nursing note and vitals reviewed.   ED Course  Procedures (including critical care time) Labs Review Labs Reviewed - No data to display  Imaging Review No results found. I have personally reviewed and evaluated these images and lab results as part of my medical decision-making.   EKG Interpretation None      MDM   Final diagnoses:  Fall, initial encounter    Patient's initial blood pressure that is systolic around 100. Given fluids. Evaluate for sepsis. He does not appear to have any infectious process and his blood pressures improved with 1 L of fluid. No tachycardia, no fever. Patient's imaging is negative. He appears safe to return to skilled nursing facility.    Arthor Captain, PA-C 05/15/15 1543  Lavera Guise, MD 05/15/15 [phone]

## 2015-05-15 NOTE — ED Notes (Signed)
Patient transported to X-ray 

## 2015-05-15 NOTE — Discharge Instructions (Signed)

## 2015-05-15 NOTE — ED Notes (Signed)
[facility]. Pt was noted lying side ways in bed with head touching floor. EMS applied c-collar and noted (+)othrostatics and given NS enroute. Pt has hx of Dementia and denies pain. MAEE.

## 2015-05-15 NOTE — ED Notes (Signed)
Awake. Verbally responsive. A/O x4. Resp even and unlabored. No audible adventitious breath sounds noted. ABC's intact.  

## 2015-05-15 NOTE — ED Notes (Signed)
Pt returned to room from x-ray without distress noted. 

## 2015-05-15 NOTE — ED Notes (Signed)
PTAR CALLED AT 1555

## 2015-05-15 NOTE — ED Notes (Signed)
Bed: WA12 Expected date:  Expected time:  Means of arrival:  Comments: fall 

## 2015-05-17 LAB — URINE CULTURE: CULTURE: NO GROWTH

## 2015-05-20 LAB — CULTURE, BLOOD (ROUTINE X 2)
CULTURE: NO GROWTH
Culture: NO GROWTH

## 2017-06-15 ENCOUNTER — Emergency Department (HOSPITAL_COMMUNITY)
Admission: EM | Admit: 2017-06-15 | Discharge: 2017-06-15 | Disposition: A | Discharge status: Skilled Nursing Facility | Attending: Emergency Medicine | Admitting: Emergency Medicine

## 2017-06-15 ENCOUNTER — Encounter (HOSPITAL_COMMUNITY): Payer: Self-pay

## 2017-06-15 DIAGNOSIS — W19XXXA Unspecified fall, initial encounter: Secondary | ICD-10-CM

## 2017-06-15 DIAGNOSIS — I509 Heart failure, unspecified: Secondary | ICD-10-CM | POA: Insufficient documentation

## 2017-06-15 DIAGNOSIS — W1830XA Fall on same level, unspecified, initial encounter: Secondary | ICD-10-CM | POA: Insufficient documentation

## 2017-06-15 DIAGNOSIS — Y92129 Unspecified place in nursing home as the place of occurrence of the external cause: Secondary | ICD-10-CM | POA: Diagnosis not present

## 2017-06-15 DIAGNOSIS — Y939 Activity, unspecified: Secondary | ICD-10-CM | POA: Diagnosis not present

## 2017-06-15 DIAGNOSIS — S0101XA Laceration without foreign body of scalp, initial encounter: Secondary | ICD-10-CM | POA: Insufficient documentation

## 2017-06-15 DIAGNOSIS — Z79899 Other long term (current) drug therapy: Secondary | ICD-10-CM | POA: Diagnosis not present

## 2017-06-15 DIAGNOSIS — F0390 Unspecified dementia without behavioral disturbance: Secondary | ICD-10-CM | POA: Insufficient documentation

## 2017-06-15 DIAGNOSIS — Y999 Unspecified external cause status: Secondary | ICD-10-CM | POA: Diagnosis not present

## 2017-06-15 NOTE — ED Triage Notes (Signed)
Per EMS, [facility], presents w/ laceration to top of head after a fall tonight.  Bleeding controlled.  No blood thinners reported.  Facility staff reported to EMS that they heard his bed alarm go off and found him in the floor.  Hx of dementia.

## 2017-06-15 NOTE — ED Notes (Signed)
Attempted to call report w/o answer x2.

## 2017-06-15 NOTE — ED Notes (Signed)
Attempt to call report-no one answering the phone

## 2017-06-15 NOTE — ED Notes (Signed)
[facility] and provided report.  She verbalized understanding.

## 2017-06-15 NOTE — ED Provider Notes (Signed)
[name]-EMERGENCY DEPT Provider Note   CSN: [id number] Arrival date & time: 06/15/17  0411     History   Chief Complaint Chief Complaint  Patient presents with  . Fall  . Head Injury    HPI Russell Fisher is a 84 y.o. male.  The history is provided by the nursing home and the EMS personnel (Dementia). The history is limited by the condition of the patient.  Fall   Head Injury    He was brought here by ambulance after having an unwitnessed fall at the nursing home where he resides.  He suffered a laceration to his scalp.  He is not on any anticoagulants.  Patient is completely noncommunicative.  He is a hospice patient and DNR.  Past Medical History:  Diagnosis Date  . Anxiety   . Aortic regurgitation    mild to moderate  . Bronchiectasis    ct chest 05/09/10  . CHF (congestive heart failure) (HCC)   . Depression   . DJD (degenerative joint disease)   . Duodenal ulcer   . Insomnia   . Systolic heart failure    with ejection fracture of 30%  . Tricuspid regurgitation    mild to moderate    Patient Active Problem List   Diagnosis Date Noted  . Influenza due to identified novel influenza A virus with other respiratory manifestations 05/06/2015  . Healthcare-associated pneumonia 05/05/2015  . Sepsis (HCC) 05/05/2015  . Acute encephalopathy 05/05/2015  . Altered mental status 07/07/2011  . Vomiting 07/07/2011  . Fall at home 05/15/2011  . Hypoglycemia 05/15/2011  . CHF (congestive heart failure) (HCC) 05/15/2011  . Contusion 05/15/2011  . DJD (degenerative joint disease) 05/15/2011    Past Surgical History:  Procedure Laterality Date  . APPENDECTOMY    . bilateral cataract repair    . HEMORRHOID SURGERY    . HERNIA REPAIR    . left hydrocele    . TOTAL KNEE ARTHROPLASTY     right        Home Medications    Prior to Admission medications   Medication Sig Start Date End Date Taking? Authorizing Provider  acetaminophen  (TYLENOL) 500 MG tablet Take 500 mg by mouth every 6 (six) hours as needed for mild pain or headache.     [provider]  Carboxymeth-Glycerin-Polysorb (REFRESH OPTIVE ADVANCED) 0.5-1-0.5 % SOLN Place 1 drop into both eyes 2 (two) times daily as needed (for dry eyes).    [provider]  finasteride (PROSCAR) 5 MG tablet Take 5 mg by mouth daily with breakfast.     [provider]  guaifenesin (ROBITUSSIN) 100 MG/5ML syrup Take 200 mg by mouth every 6 (six) hours as needed for cough.     [provider]  LORazepam (ATIVAN) 0.5 MG tablet Take 0.5 mg by mouth 2 (two) times daily as needed for anxiety.    [provider]  magnesium hydroxide (MILK OF MAGNESIA) 400 MG/5ML suspension Take 30 mLs by mouth at bedtime as needed for mild constipation.    [provider]  metoCLOPramide (REGLAN) 5 MG tablet Take 1 tablet (5 mg total) by mouth 3 (three) times daily. 05/10/15 02/26/19  Joseph ArtVann, Jessica U, DO  mirtazapine (REMERON) 15 MG tablet Take 45 mg by mouth at bedtime.    [provider]  Multiple Vitamin (THERA) TABS Take 1 tablet by mouth daily.    [provider]  Multiple Vitamins-Minerals (I-VITE) TABS Take 1 tablet by mouth daily  with breakfast.    [provider]  polyethylene glycol (MIRALAX / GLYCOLAX) packet Take 17 g by mouth daily with breakfast.     [provider]  saccharomyces boulardii (FLORASTOR) 250 MG capsule Take 1 capsule (250 mg total) by mouth 2 (two) times daily. 05/10/15   Joseph Art, DO  sertraline (ZOLOFT) 50 MG tablet Take 50 mg by mouth daily with breakfast.     [provider]  traMADol (ULTRAM) 50 MG tablet Take 1 tablet (50 mg total) by mouth every 6 (six) hours as needed. Patient taking differently: Take 50 mg by mouth every 6 (six) hours as needed (for pain).  02/24/15   Charlestine Night, PA-C    Family History Family History  Problem Relation Age of Onset  . Heart  disease Mother     Social History Social History   Tobacco Use  . Smoking status: Never Smoker  . Smokeless tobacco: Never Used  Substance Use Topics  . Alcohol use: No  . Drug use: No     Allergies   Penicillins   Review of Systems Review of Systems  Unable to perform ROS: Dementia     Physical Exam Updated Vital Signs BP 130/70 (BP Location: Left Arm)   Pulse 66   Temp (!) 97.5 F (36.4 C) (Oral)   Resp 18   SpO2 97%   Physical Exam  Nursing note and vitals reviewed.  84 year old male, resting comfortably and in no acute distress. Vital signs are normal. Oxygen saturation is 97%, which is normal. Head is normocephalic.  Linear scalp laceration present just to the right of the midline. PERRLA, EOMI. Oropharynx is clear. Neck is nontender without adenopathy or JVD. Back is nontender and there is no CVA tenderness. Lungs are clear without rales, wheezes, or rhonchi. Chest is nontender. Heart has regular rate and rhythm without murmur. Abdomen is soft, flat, nontender without masses or hepatosplenomegaly and peristalsis is normoactive. Extremities have no cyanosis or edema, full range of motion is present. Skin is warm and dry without rash. Neurologic: He is awake but nonverbal.  He does respond to pain with purposeful movement and moves all extremities equally.  No focal findings.  ED Treatments / Results   Procedures .Marland KitchenLaceration Repair Date/Time: 06/15/2017 5:14 AM Performed by: Dione Booze, MD Authorized by: Dione Booze, MD   Consent:    Consent obtained: Implied Consent.   Consent given by:  Healthcare agent   Risks discussed:  Infection and pain   Alternatives discussed:  No treatment Laceration details:    Location:  Scalp   Scalp location:  Frontal   Length (cm):  7   Depth (mm):  4 Repair type:    Repair type:  Simple Pre-procedure details:    Preparation:  Patient was prepped and draped in usual sterile fashion Exploration:    Hemostasis  achieved with:  Direct pressure   Wound exploration: entire depth of wound probed and visualized     Wound extent: no foreign bodies/material noted     Contaminated: no   Treatment:    Area cleansed with:  Saline   Amount of cleaning:  Standard Skin repair:    Repair method:  Staples   Number of staples:  11 Approximation:    Approximation:  Close Post-procedure details:    Dressing:  Antibiotic ointment   Patient tolerance of procedure:  Tolerated well, no immediate complications    Medications Ordered in ED Medications - No data to display  Initial Impression / Assessment and Plan / ED Course  I have reviewed the triage vital signs and the nursing notes.  Fall with scalp laceration.  Laceration is closed with stapling.  Old records are reviewed, and he has prior ED visits for falls.  Given his DNR status and hospice status, no imaging is obtained.  He is returned to his nursing facility with instructions to have staples removed in 7-10 days.  Final Clinical Impressions(s) / ED Diagnoses   Final diagnoses:  Fall at nursing home, initial encounter  Scalp laceration, initial encounter    ED Discharge Orders    None       Dione Booze, MD 06/15/17 [phone]

## 2017-06-15 NOTE — ED Notes (Signed)
Bed: ZO10WA16 Expected date:  Expected time:  Means of arrival:  Comments: EMS 84 yo male fall-hit top of head-no blood thinners-advanced dementia

## 2017-06-15 NOTE — ED Notes (Signed)
PTAR at bedside 

## 2017-07-11 DEATH — deceased
